# Patient Record
Sex: Female | Born: 1965
Health system: Southern US, Community
[De-identification: ages and names within clinical notes are randomized; demographics above are authoritative.]

## PROBLEM LIST (undated history)

## (undated) DIAGNOSIS — I10 Essential (primary) hypertension: Secondary | ICD-10-CM

## (undated) DIAGNOSIS — Z973 Presence of spectacles and contact lenses: Secondary | ICD-10-CM

## (undated) DIAGNOSIS — N882 Stricture and stenosis of cervix uteri: Secondary | ICD-10-CM

## (undated) DIAGNOSIS — Z860100 Personal history of colon polyps, unspecified: Secondary | ICD-10-CM

## (undated) DIAGNOSIS — E785 Hyperlipidemia, unspecified: Secondary | ICD-10-CM

## (undated) DIAGNOSIS — N840 Polyp of corpus uteri: Secondary | ICD-10-CM

## (undated) DIAGNOSIS — I4510 Unspecified right bundle-branch block: Secondary | ICD-10-CM

## (undated) HISTORY — PX: COLONOSCOPY: SHX174

## (undated) HISTORY — PX: CERVICAL BIOPSY  W/ LOOP ELECTRODE EXCISION: SUR135

---

## 2002-05-15 ENCOUNTER — Other Ambulatory Visit: Admission: RE | Admit: 2002-05-15 | Discharge: 2002-05-15 | Payer: Self-pay | Admitting: Obstetrics and Gynecology

## 2003-05-30 ENCOUNTER — Other Ambulatory Visit: Admission: RE | Admit: 2003-05-30 | Discharge: 2003-05-30 | Payer: Self-pay | Admitting: Obstetrics and Gynecology

## 2004-03-16 ENCOUNTER — Ambulatory Visit: Payer: Self-pay | Admitting: Internal Medicine

## 2004-03-25 ENCOUNTER — Ambulatory Visit: Payer: Self-pay | Admitting: Internal Medicine

## 2004-06-05 ENCOUNTER — Other Ambulatory Visit: Admission: RE | Admit: 2004-06-05 | Discharge: 2004-06-05 | Payer: Self-pay | Admitting: Obstetrics and Gynecology

## 2005-06-11 ENCOUNTER — Other Ambulatory Visit: Admission: RE | Admit: 2005-06-11 | Discharge: 2005-06-11 | Payer: Self-pay | Admitting: Obstetrics and Gynecology

## 2005-08-19 ENCOUNTER — Ambulatory Visit: Payer: Self-pay | Admitting: Internal Medicine

## 2005-08-27 ENCOUNTER — Ambulatory Visit: Payer: Self-pay | Admitting: Internal Medicine

## 2005-10-04 ENCOUNTER — Ambulatory Visit: Payer: Self-pay | Admitting: Internal Medicine

## 2005-10-08 ENCOUNTER — Encounter (INDEPENDENT_AMBULATORY_CARE_PROVIDER_SITE_OTHER): Payer: Self-pay | Admitting: Specialist

## 2005-10-08 ENCOUNTER — Ambulatory Visit: Admission: RE | Admit: 2005-10-08 | Discharge: 2005-10-08 | Payer: Self-pay | Admitting: Surgery

## 2005-10-08 HISTORY — PX: LAPAROSCOPIC CHOLECYSTECTOMY: SUR755

## 2005-12-03 ENCOUNTER — Ambulatory Visit: Payer: Self-pay | Admitting: Internal Medicine

## 2006-06-09 ENCOUNTER — Ambulatory Visit: Payer: Self-pay | Admitting: Internal Medicine

## 2006-06-10 LAB — CONVERTED CEMR LAB
HCT: 41.5 % (ref 36.0–46.0)
MCV: 81.5 fL (ref 78.0–100.0)
Neutrophils Relative %: 59.7 % (ref 43.0–77.0)
Platelets: 162 10*3/uL (ref 150–400)
RDW: 12.3 % (ref 11.5–14.6)

## 2006-10-06 ENCOUNTER — Ambulatory Visit: Payer: Self-pay | Admitting: Internal Medicine

## 2007-03-17 DIAGNOSIS — D649 Anemia, unspecified: Secondary | ICD-10-CM | POA: Insufficient documentation

## 2007-03-17 DIAGNOSIS — M6740 Ganglion, unspecified site: Secondary | ICD-10-CM | POA: Insufficient documentation

## 2007-03-17 DIAGNOSIS — L659 Nonscarring hair loss, unspecified: Secondary | ICD-10-CM

## 2007-03-31 ENCOUNTER — Ambulatory Visit: Payer: Self-pay | Admitting: Internal Medicine

## 2007-03-31 LAB — CONVERTED CEMR LAB
Basophils Relative: 0.4 % (ref 0.0–1.0)
HCT: 41.6 % (ref 36.0–46.0)
Hemoglobin: 13.9 g/dL (ref 12.0–15.0)
Lymphocytes Relative: 32.3 % (ref 12.0–46.0)
Monocytes Relative: 5.1 % (ref 3.0–11.0)
Platelets: 124 10*3/uL — ABNORMAL LOW (ref 150–400)
RDW: 13 % (ref 11.5–14.6)

## 2007-08-21 ENCOUNTER — Encounter: Payer: Self-pay | Admitting: Internal Medicine

## 2007-12-11 ENCOUNTER — Ambulatory Visit: Payer: Self-pay | Admitting: Internal Medicine

## 2007-12-11 DIAGNOSIS — M542 Cervicalgia: Secondary | ICD-10-CM

## 2009-08-22 ENCOUNTER — Encounter: Admission: RE | Admit: 2009-08-22 | Discharge: 2009-08-22 | Payer: Self-pay | Admitting: Obstetrics and Gynecology

## 2010-05-17 ENCOUNTER — Encounter: Payer: Self-pay | Admitting: Obstetrics and Gynecology

## 2010-06-10 ENCOUNTER — Other Ambulatory Visit (INDEPENDENT_AMBULATORY_CARE_PROVIDER_SITE_OTHER): Payer: BC Managed Care – PPO | Admitting: Internal Medicine

## 2010-06-10 DIAGNOSIS — Z0000 Encounter for general adult medical examination without abnormal findings: Secondary | ICD-10-CM

## 2010-06-10 LAB — BASIC METABOLIC PANEL
CO2: 26 mEq/L (ref 19–32)
Calcium: 10 mg/dL (ref 8.4–10.5)
Chloride: 106 mEq/L (ref 96–112)
Glucose, Bld: 86 mg/dL (ref 70–99)
Potassium: 4.4 mEq/L (ref 3.5–5.1)
Sodium: 139 mEq/L (ref 135–145)

## 2010-06-10 LAB — LIPID PANEL
HDL: 46.6 mg/dL (ref >39.00)
LDL Cholesterol: 88 mg/dL (ref 0–99)
Triglycerides: 166 mg/dL — ABNORMAL HIGH (ref 0.0–149.0)
VLDL: 33.2 mg/dL (ref 0.0–40.0)

## 2010-06-10 LAB — POCT URINALYSIS DIPSTICK
Bilirubin, UA: NEGATIVE
Blood, UA: NEGATIVE
Glucose, UA: NEGATIVE
Leukocytes, UA: NEGATIVE
Spec Grav, UA: 1.005

## 2010-06-10 LAB — HEPATIC FUNCTION PANEL: AST: 20 U/L (ref 0–37)

## 2010-06-10 LAB — TSH: TSH: 2.86 u[IU]/mL (ref 0.35–5.50)

## 2010-06-11 LAB — CBC WITH DIFFERENTIAL/PLATELET
Basophils Absolute: 0.1 10*3/uL (ref 0.0–0.1)
Eosinophils Absolute: 0 10*3/uL (ref 0.0–0.7)
Eosinophils Relative: 0.3 % (ref 0.0–5.0)
HCT: 44.5 % (ref 36.0–46.0)
Neutro Abs: 5.2 10*3/uL (ref 1.4–7.7)
Neutrophils Relative %: 53.8 % (ref 43.0–77.0)
Platelets: 215 10*3/uL (ref 150.0–400.0)
RBC: 5.23 Mil/uL — ABNORMAL HIGH (ref 3.87–5.11)
WBC: 9.7 10*3/uL (ref 4.5–10.5)

## 2010-06-26 ENCOUNTER — Encounter: Payer: Self-pay | Admitting: Internal Medicine

## 2010-06-29 ENCOUNTER — Encounter: Payer: Self-pay | Admitting: Internal Medicine

## 2010-06-29 ENCOUNTER — Ambulatory Visit (INDEPENDENT_AMBULATORY_CARE_PROVIDER_SITE_OTHER): Payer: BC Managed Care – PPO | Admitting: Internal Medicine

## 2010-06-29 VITALS — BP 114/84 | HR 84 | Temp 99.2°F | Ht 59.0 in | Wt 158.0 lb

## 2010-06-29 DIAGNOSIS — Z0000 Encounter for general adult medical examination without abnormal findings: Secondary | ICD-10-CM

## 2010-06-29 DIAGNOSIS — Z136 Encounter for screening for cardiovascular disorders: Secondary | ICD-10-CM

## 2010-09-11 NOTE — Op Note (Signed)
Morgan Barker, Morgan Barker                 ACCOUNT NO.:  [id number]   MEDICAL RECORD NO.:  [id number]          PATIENT TYPE:  AMB   LOCATION:  [facility]                         FACILITY:  [facility]   PHYSICIAN:  Wilmon Arms. Corliss Skains, M.D. DATE OF BIRTH:  [birth year]   DATE OF PROCEDURE:  10/08/2005  DATE OF DISCHARGE:                                 OPERATIVE REPORT   PREOP DIAGNOSIS:  Chronic calculus cholecystitis.   POSTOPERATIVE DIAGNOSIS:  Chronic calculus cholecystitis.   PROCEDURE PERFORMED:  Laparoscopic cholecystectomy with intraoperative  cholangiogram.   SURGEON:  Wilmon Arms. Tsuei, M.D.   ANESTHESIA:  General endotracheal.   INDICATIONS:  The patient is a 45 year old female who presents with a 3-  month history of right upper quadrant epigastric pain radiating through to  her back.  This was associated with some nausea and bloating.  Liver  function tests were normal.  Abdominal ultrasound showed several mobile  gallstones.  There was no gallbladder wall thickening.  The decision was  made to proceed with laparoscopic cholecystectomy.   DESCRIPTION OF PROCEDURE:  The patient was brought to the operating room,  placed in the supine position operating table.  After an adequate level of  general endotracheal anesthesia was obtained, the patient's abdomen was  prepped with Betadine and draped in sterile fashion.  A time-out was taken  to insure the proper patient and proper procedure.  A transverse incision  was made just below the umbilicus after infiltrating with 1/4% Marcaine.  The fascia was opened vertically; and the peritoneal cavity was bluntly  entered.  A stay sutures of #0 Vicryl was placed around the fascial opening  in pursestring fashion.  The Hasson cannula was inserted and secured with a  stay suture.  Pneumoperitoneum was obtained by insufflating CO2 and  maintaining maximum pressure of 15 mmHg.  A 10-mm port was placed in  subxiphoid position; two 5-mm ports were placed in  the right upper quadrant.   The liver and stomach appeared to be normal.  The gallbladder was grasped  with a clamp and elevated over the edge of the liver.  The omentum was  adherent to the surface of the gallbladder.  This was taken down with  cautery.  The peritoneum around the hilum of the gallbladder was opened.  The cystic duct was identified; and was circumferentially dissected.  The  cystic artery was also circumferentially dissected, ligated with clips, and  divided.  The cystic duct was ligated with a clip distally.  An opening was  created on the cystic duct; and a Cook cholangiogram catheter was inserted  through a stab incision, and threaded into the cystic duct.  It was secured  with a clip; and a cholangiogram was then obtained.  The cholangiogram  showed good flow proximally and distally in the biliary tree with good flow  into the duodenum.  There was a small filling defect in the mid common duct  which appeared to float.  Initially there were two; and then these seemed to  coalesce into one.  We asked the radiologist to also examine the  cholangiogram; there was some question whether this were presented common  duct stone versus air bubbles.  The decision was made to proceed with he  cholecystectomy as the patient's liver function tests were normal.  We will  recheck her liver function tests, next week, and proceed with ERCP if there  is any evidence of obstruction.   The cholangiogram catheter was then removed.  The cystic duct was likewise  clipped and divided.  An additional cystic artery branch was ligated with  clips and divided.  Cautery was then used to dissect the gallbladder away  from the liver.  The gallbladder was removed through the umbilical opening.  The ports were all reinserted and the gallbladder fossa was reinspected.  Hemostasis was obtained with cautery.  This area was thoroughly irrigated  and no further bleeding was noted.  The patient was rolled  back to a flat  position.  The ports removed under direct vision; as pneumoperitoneum was  released.  The umbilical fascia was closed the pursestring suture; 4-0  Monocryl was used to close all the skin incisions in a subcuticular fashion.  Steri-Strips and clean dressings were applied.  The patient was then  extubated, and brought to recovery room in stable condition.  All sponge,  instrument, and needle counts were correct.      Wilmon Arms. Tsuei, M.D.  Electronically Signed     MKT/MEDQ  D:  10/08/2005  T:  10/08/2005  Job:  [id number]

## 2011-03-29 ENCOUNTER — Other Ambulatory Visit: Payer: Self-pay

## 2011-03-30 ENCOUNTER — Other Ambulatory Visit: Payer: Self-pay

## 2011-03-30 ENCOUNTER — Emergency Department (HOSPITAL_COMMUNITY)
Admission: EM | Admit: 2011-03-30 | Discharge: 2011-03-30 | Disposition: A | Discharge status: Home / Self Care | Payer: BC Managed Care – PPO | Attending: Emergency Medicine | Admitting: Emergency Medicine

## 2011-03-30 ENCOUNTER — Encounter (HOSPITAL_COMMUNITY): Payer: Self-pay | Admitting: *Deleted

## 2011-03-30 ENCOUNTER — Emergency Department (HOSPITAL_COMMUNITY): Payer: BC Managed Care – PPO

## 2011-03-30 DIAGNOSIS — S5010XA Contusion of unspecified forearm, initial encounter: Secondary | ICD-10-CM | POA: Insufficient documentation

## 2011-03-30 DIAGNOSIS — Y92410 Unspecified street and highway as the place of occurrence of the external cause: Secondary | ICD-10-CM | POA: Insufficient documentation

## 2011-03-30 DIAGNOSIS — T1490XA Injury, unspecified, initial encounter: Secondary | ICD-10-CM | POA: Insufficient documentation

## 2011-03-30 DIAGNOSIS — R079 Chest pain, unspecified: Secondary | ICD-10-CM | POA: Insufficient documentation

## 2011-03-30 MED ORDER — CYCLOBENZAPRINE HCL 10 MG PO TABS: 10.0000 mg | ORAL_TABLET | Freq: Two times a day (BID) | ORAL | Status: AC | PRN

## 2011-03-30 MED ORDER — OXYCODONE-ACETAMINOPHEN 5-325 MG PO TABS: 1.0000 | ORAL_TABLET | Freq: Four times a day (QID) | ORAL | Status: AC | PRN

## 2011-03-30 MED ORDER — LORAZEPAM 0.5 MG PO TABS
0.5000 mg | ORAL_TABLET | Freq: Once | ORAL | Status: AC
Start: 2011-03-30 — End: 2011-03-30
  Administered 2011-03-30: 0.5 mg via ORAL
  Filled 2011-03-30: qty 1
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
## 2011-03-30 NOTE — ED Provider Notes (Signed)
Medical screening examination/treatment/procedure(s) were performed by non-physician practitioner and as supervising physician I was immediately available for consultation/collaboration.   Joya Gaskins, MD 03/30/11 [phone]

## 2011-03-30 NOTE — ED Notes (Signed)
Called pt's brother per pt request, left a message and return number.
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
## 2011-05-11 ENCOUNTER — Ambulatory Visit (INDEPENDENT_AMBULATORY_CARE_PROVIDER_SITE_OTHER): Payer: BC Managed Care – PPO | Admitting: Family

## 2011-05-11 ENCOUNTER — Encounter: Payer: Self-pay | Admitting: Family

## 2011-05-11 VITALS — BP 156/104 | Temp 99.9°F | Wt 181.0 lb

## 2011-05-11 DIAGNOSIS — I10 Essential (primary) hypertension: Secondary | ICD-10-CM

## 2011-05-11 DIAGNOSIS — R42 Dizziness and giddiness: Secondary | ICD-10-CM

## 2011-05-11 MED ORDER — HYDROCHLOROTHIAZIDE 25 MG PO TABS: 25.0000 mg | ORAL_TABLET | Freq: Every day | ORAL | Status: DC

## 2011-05-11 NOTE — Progress Notes (Signed)
Subjective:    Patient ID: Morgan Barker, female    DOB: 1965-10-13, 46 y.o.   MRN: [id number]  HPI 46 year old [place] American female, nonsmoker, patient of Dr. Lovell Sheehan is an today with complaints of elevated blood pressure. 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 She has not been hypertensive in the past. She does have a family history of hypertension. She's had a 25 pound weight gain over the last 10 months. She was previously exercising and is not exercising anymore. She denies any shortness of breath, chest pain, nausea, vomiting, or edema.  Review of Systems  Constitutional: Negative.   HENT: Negative.   Eyes: Negative.   Respiratory: Negative.   Cardiovascular: Negative.   Gastrointestinal: Negative.   Genitourinary: Negative.   Musculoskeletal: Negative.   Skin: Negative.   Neurological: Positive for light-headedness.  Hematological: Negative.   Psychiatric/Behavioral: Negative.    Past Medical History  Diagnosis Date  . Anemia     History   Social History  . Marital Status: Single    Spouse Name: N/A    Number of Children: N/A  . Years of Education: N/A   Occupational History  . Not on file.   Social History Main Topics  . Smoking status: Never Smoker   . Smokeless tobacco: Not on file  . Alcohol Use: Not on file  . Drug Use: Not on file  . Sexually Active: Not on file   Other Topics Concern  . Not on file   Social History Narrative  . No narrative on file    Past Surgical History  Procedure Date  . Cholecystectomy     No family history on file.  No Known Allergies  Current Outpatient Prescriptions on File Prior to Visit  Medication Sig Dispense Refill  . desogestrel-ethinyl estradiol (KARIVA,AZURETTE,MIRCETTE) 0.15-0.02/0.01 MG (21/5) tablet Take 1 tablet by mouth daily.        . Multiple Vitamins-Iron (MULTIVITAMIN/IRON PO) Take by mouth daily.        Marland Kitchen  AZURETTE 0.15-0.02/0.01 MG (21/5) per tablet       . celecoxib (CELEBREX) 200 MG capsule Take 200 mg by mouth 2 (two) times daily.       . cyclobenzaprine (FEXMID) 7.5 MG tablet Take 7.5 mg by mouth 3 (three) times daily as needed.          BP 156/104  Temp(Src) 99.9 F (37.7 C) (Oral)  Wt 181 lb (82.101 kg)chart    Objective:   Physical Exam  Constitutional: She is oriented to person, place, and time. She appears well-developed and well-nourished.  HENT:  Right Ear: External ear normal.  Left Ear: External ear normal.  Nose: Nose normal.  Mouth/Throat: Oropharynx is clear and moist.  Eyes: Conjunctivae are normal. Pupils are equal, round, and reactive to light.  Cardiovascular: Normal rate, regular rhythm and normal heart sounds.   Pulmonary/Chest: Effort normal and breath sounds normal.  Musculoskeletal: Normal range of motion.  Neurological: She is alert and oriented to person, place, and time.  Skin: Skin is warm and dry.  Psychiatric: She has a normal mood and affect.          Assessment & Plan:  Assessment: Hypertension, weight  gain  Plan: HCTZ 25 mg once daily. Exercise to reduce her weight. Watch sodium intake in her diet. We'll bring patient back for recheck in 2 weeks to reassess her blood pressure. Patient to call with  any questions or concerns.

## 2011-05-11 NOTE — Patient Instructions (Signed)
Hypertension As your heart beats, it forces blood through your arteries. This force is your blood pressure. If the pressure is too high, it is called hypertension (HTN) or high blood pressure. HTN is dangerous because you may have it and not know it. High blood pressure may mean that your heart has to work harder to pump blood. Your arteries may be narrow or stiff. The extra work puts you at risk for heart disease, stroke, and other problems.  Blood pressure consists of two numbers, a higher number over a lower, 110/72, for example. It is stated as "110 over 72." The ideal is below 120 for the top number (systolic) and under 80 for the bottom (diastolic). Write down your blood pressure today. You should pay close attention to your blood pressure if you have certain conditions such as:  Heart failure.   Prior heart attack.   Diabetes   Chronic kidney disease.   Prior stroke.   Multiple risk factors for heart disease.  To see if you have HTN, your blood pressure should be measured while you are seated with your arm held at the level of the heart. It should be measured at least twice. A one-time elevated blood pressure reading (especially in the Emergency Department) does not mean that you need treatment. There may be conditions in which the blood pressure is different between your right and left arms. It is important to see your caregiver soon for a recheck. Most people have essential hypertension which means that there is not a specific cause. This type of high blood pressure may be lowered by changing lifestyle factors such as:  Stress.   Smoking.   Lack of exercise.   Excessive weight.   Drug/tobacco/alcohol use.   Eating less salt.  Most people do not have symptoms from high blood pressure until it has caused damage to the body. Effective treatment can often prevent, delay or reduce that damage. TREATMENT  When a cause has been identified, treatment for high blood pressure is  directed at the cause. There are a large number of medications to treat HTN. These fall into several categories, and your caregiver will help you select the medicines that are best for you. Medications may have side effects. You should review side effects with your caregiver. If your blood pressure stays high after you have made lifestyle changes or started on medicines,   Your medication(s) may need to be changed.   Other problems may need to be addressed.   Be certain you understand your prescriptions, and know how and when to take your medicine.   Be sure to follow up with your caregiver within the time frame advised (usually within two weeks) to have your blood pressure rechecked and to review your medications.   If you are taking more than one medicine to lower your blood pressure, make sure you know how and at what times they should be taken. Taking two medicines at the same time can result in blood pressure that is too low.  SEEK IMMEDIATE MEDICAL CARE IF:  You develop a severe headache, blurred or changing vision, or confusion.   You have unusual weakness or numbness, or a faint feeling.   You have severe chest or abdominal pain, vomiting, or breathing problems.  MAKE SURE YOU:   Understand these instructions.   Will watch your condition.   Will get help right away if you are not doing well or get worse.  Document Released: 04/12/2005 Document Revised: 12/23/2010 Document Reviewed:   12/01/2007 [employer] Patient Information 2012 [place], [place].  Exercise to Lose Weight Exercise and a healthy diet may help you lose weight. Your doctor may suggest specific exercises. EXERCISE IDEAS AND TIPS  Choose low-cost things you enjoy doing, such as walking, bicycling, or exercising to workout videos.   Take stairs instead of the elevator.   Walk during your lunch break.   Park your car further away from work or school.   Go to a gym or an exercise class.   Start with 5 to 10  minutes of exercise each day. Build up to 30 minutes of exercise 4 to 6 days a week.   Wear shoes with good support and comfortable clothes.   Stretch before and after working out.   Work out until you breathe harder and your heart beats faster.   Drink extra water when you exercise.   Do not do so much that you hurt yourself, feel dizzy, or get very short of breath.  Exercises that burn about 150 calories:  Running 1  miles in 15 minutes.   Playing volleyball for 45 to 60 minutes.   Washing and waxing a car for 45 to 60 minutes.   Playing touch football for 45 minutes.   Walking 1  miles in 35 minutes.   Pushing a stroller 1  miles in 30 minutes.   Playing basketball for 30 minutes.   Raking leaves for 30 minutes.   Bicycling 5 miles in 30 minutes.   Walking 2 miles in 30 minutes.   Dancing for 30 minutes.   Shoveling snow for 15 minutes.   Swimming laps for 20 minutes.   Walking up stairs for 15 minutes.   Bicycling 4 miles in 15 minutes.   Gardening for 30 to 45 minutes.   Jumping rope for 15 minutes.   Washing windows or floors for 45 to 60 minutes.  Document Released: 05/15/2010 Document Revised: 12/23/2010 Document Reviewed: 05/15/2010 [facility] Patient Information 2012 [place], [place].

## 2011-05-25 ENCOUNTER — Encounter: Payer: Self-pay | Admitting: Family

## 2011-05-25 ENCOUNTER — Ambulatory Visit (INDEPENDENT_AMBULATORY_CARE_PROVIDER_SITE_OTHER): Payer: BC Managed Care – PPO | Admitting: Family

## 2011-05-25 VITALS — BP 130/90 | HR 88 | Temp 98.3°F | Resp 16 | Ht 59.0 in | Wt 177.0 lb

## 2011-05-25 DIAGNOSIS — I10 Essential (primary) hypertension: Secondary | ICD-10-CM

## 2011-05-25 DIAGNOSIS — E669 Obesity, unspecified: Secondary | ICD-10-CM

## 2011-05-25 NOTE — Progress Notes (Signed)
  Subjective:    Patient ID: Morgan Barker, female    DOB: May 29, 1965, 46 y.o.   MRN: [id number]  HPI 46 year old [place] American female, nonsmoker, patient of Dr. Lovell Sheehan is in for recheck of hypertension. She was started on hydrochlorothiazide 25 mg daily, she's tolerating the medication well. She is currently 177 pounds, which is a significant weight gain from last year. She has been trying to modify her diet and get more exercise recently. She denies any lightheadedness, dizziness, chest pain, palpitations, shortness of breath or edema.  Complete physical exam is due in March   Review of Systems  Constitutional: Negative.   HENT: Negative.   Cardiovascular: Negative.   Gastrointestinal: Negative.   Musculoskeletal: Negative.   Skin: Negative.   Neurological: Negative.   Hematological: Negative.   Psychiatric/Behavioral: Negative.    Past Medical History  Diagnosis Date  . Anemia     History   Social History  . Marital Status: Single    Spouse Name: N/A    Number of Children: N/A  . Years of Education: N/A   Occupational History  . Not on file.   Social History Main Topics  . Smoking status: Never Smoker   . Smokeless tobacco: Not on file  . Alcohol Use: Not on file  . Drug Use: Not on file  . Sexually Active: Not on file   Other Topics Concern  . Not on file   Social History Narrative  . No narrative on file    Past Surgical History  Procedure Date  . Cholecystectomy     No family history on file.  No Known Allergies  Current Outpatient Prescriptions on File Prior to Visit  Medication Sig Dispense Refill  . AZURETTE 0.15-0.02/0.01 MG (21/5) per tablet       . hydrochlorothiazide (HYDRODIURIL) 25 MG tablet Take 1 tablet (25 mg total) by mouth daily.  30 tablet  1  . Multiple Vitamins-Iron (MULTIVITAMIN/IRON PO) Take by mouth daily.          BP 130/90  Pulse 88  Temp 98.3 F (36.8 C)  Resp 16  Ht 4\' 11"  (1.499 m)  Wt 177 lb (80.287 kg)  BMI  35.75 kg/m2chart    Objective:   Physical Exam  Constitutional: She is oriented to person, place, and time. She appears well-developed and well-nourished.  HENT:  Right Ear: External ear normal.  Left Ear: External ear normal.  Nose: Nose normal.  Mouth/Throat: Oropharynx is clear and moist.  Neck: Normal range of motion. Neck supple.  Cardiovascular: Normal rate, regular rhythm and normal heart sounds.   Pulmonary/Chest: Effort normal and breath sounds normal.  Musculoskeletal: Normal range of motion.  Neurological: She is alert and oriented to person, place, and time.  Skin: Skin is warm and dry.  Psychiatric: She has a normal mood and affect.          Assessment & Plan:  Assessment: Hypertension, obesity  Plan: Strongly encouraged healthy diet and exercise, low sodium diet. Encouraged weight reduction. We'll continue her on hydrochlorothiazide once daily. She will followup with Dr. Lovell Sheehan for complete physical exam in March and schedule. Lab order sent to include BMP, CBC, TSH, and lipids. Patient to call the office with any questions or concerns prior to that appointment.

## 2011-05-25 NOTE — Patient Instructions (Signed)
1. Coricidin HBP OTC for upper respiratory symptoms. 2. Follow-up for annual CPX  Exercise to Stay Healthy Exercise helps you become and stay healthy. EXERCISE IDEAS AND TIPS Choose exercises that:  You enjoy.   Fit into your day.  You do not need to exercise really hard to be healthy. You can do exercises at a slow or medium level and stay healthy. You can:  Stretch before and after working out.   Try yoga, Pilates, or tai chi.   Lift weights.   Walk fast, swim, jog, run, climb stairs, bicycle, dance, or rollerskate.   Take aerobic classes.  Exercises that burn about 150 calories:  Running 1  miles in 15 minutes.   Playing volleyball for 45 to 60 minutes.   Washing and waxing a car for 45 to 60 minutes.   Playing touch football for 45 minutes.   Walking 1  miles in 35 minutes.   Pushing a stroller 1  miles in 30 minutes.   Playing basketball for 30 minutes.   Raking leaves for 30 minutes.   Bicycling 5 miles in 30 minutes.   Walking 2 miles in 30 minutes.   Dancing for 30 minutes.   Shoveling snow for 15 minutes.   Swimming laps for 20 minutes.   Walking up stairs for 15 minutes.   Bicycling 4 miles in 15 minutes.   Gardening for 30 to 45 minutes.   Jumping rope for 15 minutes.   Washing windows or floors for 45 to 60 minutes.  Document Released: 05/15/2010 Document Revised: 12/23/2010 Document Reviewed: 05/15/2010 [facility] Patient Information 2012 [place], [place].

## 2011-06-24 ENCOUNTER — Other Ambulatory Visit (INDEPENDENT_AMBULATORY_CARE_PROVIDER_SITE_OTHER): Payer: BC Managed Care – PPO

## 2011-06-24 DIAGNOSIS — I10 Essential (primary) hypertension: Secondary | ICD-10-CM

## 2011-06-24 DIAGNOSIS — Z0000 Encounter for general adult medical examination without abnormal findings: Secondary | ICD-10-CM

## 2011-06-24 LAB — BASIC METABOLIC PANEL
BUN: 10 mg/dL (ref 6–23)
Calcium: 9.5 mg/dL (ref 8.4–10.5)
Creatinine, Ser: 0.8 mg/dL (ref 0.4–1.2)
GFR: 106.31 mL/min (ref >60.00)

## 2011-06-24 LAB — POCT URINALYSIS DIPSTICK
Bilirubin, UA: NEGATIVE
Glucose, UA: NEGATIVE
Ketones, UA: NEGATIVE
Protein, UA: NEGATIVE
Urobilinogen, UA: 0.2
pH, UA: 6

## 2011-06-24 LAB — CBC
Platelets: 245 10*3/uL (ref 150.0–400.0)
RBC: 5.11 Mil/uL (ref 3.87–5.11)
RDW: 13.5 % (ref 11.5–14.6)

## 2011-06-24 LAB — CHOLESTEROL, TOTAL: Cholesterol: 140 mg/dL (ref 0–200)

## 2011-06-24 LAB — LIPID PANEL
LDL Cholesterol: 56 mg/dL (ref 0–99)
Triglycerides: 165 mg/dL — ABNORMAL HIGH (ref 0.0–149.0)
VLDL: 33 mg/dL (ref 0.0–40.0)

## 2011-06-24 LAB — HEPATIC FUNCTION PANEL
ALT: 26 U/L (ref 0–35)
Alkaline Phosphatase: 36 U/L — ABNORMAL LOW (ref 39–117)
Bilirubin, Direct: 0 mg/dL (ref 0.0–0.3)

## 2011-06-24 LAB — TSH: TSH: 2.11 u[IU]/mL (ref 0.35–5.50)

## 2011-07-02 ENCOUNTER — Ambulatory Visit (INDEPENDENT_AMBULATORY_CARE_PROVIDER_SITE_OTHER): Payer: BC Managed Care – PPO | Admitting: Internal Medicine

## 2011-07-02 ENCOUNTER — Encounter: Payer: Self-pay | Admitting: Internal Medicine

## 2011-07-02 VITALS — BP 140/90 | HR 76 | Temp 98.3°F | Resp 16 | Ht 59.0 in | Wt 176.0 lb

## 2011-07-02 DIAGNOSIS — Z0000 Encounter for general adult medical examination without abnormal findings: Secondary | ICD-10-CM

## 2011-07-02 DIAGNOSIS — I10 Essential (primary) hypertension: Secondary | ICD-10-CM | POA: Insufficient documentation

## 2011-07-02 MED ORDER — LOSARTAN POTASSIUM-HCTZ 50-12.5 MG PO TABS: 1.0000 | ORAL_TABLET | Freq: Every day | ORAL | Status: DC

## 2011-07-02 NOTE — Patient Instructions (Signed)
The patient is instructed to continue all medications as prescribed. Schedule followup with check out clerk upon leaving the clinic  

## 2011-07-02 NOTE — Progress Notes (Signed)
  Subjective:    Patient ID: Morgan Barker, female    DOB: [birth year], 46 y.o.   MRN: [id number]  HPI    Review of Systems     Objective:   Physical Exam        Assessment & Plan:

## 2011-08-24 ENCOUNTER — Other Ambulatory Visit: Payer: Self-pay | Admitting: Obstetrics and Gynecology

## 2011-08-24 DIAGNOSIS — R928 Other abnormal and inconclusive findings on diagnostic imaging of breast: Secondary | ICD-10-CM

## 2011-08-27 ENCOUNTER — Ambulatory Visit
Admission: RE | Admit: 2011-08-27 | Discharge: 2011-08-27 | Disposition: A | Discharge status: Home / Self Care | Payer: BC Managed Care – PPO | Source: Ambulatory Visit | Attending: Obstetrics and Gynecology | Admitting: Obstetrics and Gynecology

## 2011-08-27 DIAGNOSIS — R928 Other abnormal and inconclusive findings on diagnostic imaging of breast: Secondary | ICD-10-CM

## 2011-08-30 ENCOUNTER — Other Ambulatory Visit: Payer: Self-pay | Admitting: *Deleted

## 2011-08-30 DIAGNOSIS — I10 Essential (primary) hypertension: Secondary | ICD-10-CM

## 2011-08-30 MED ORDER — LOSARTAN POTASSIUM-HCTZ 50-12.5 MG PO TABS: 1.0000 | ORAL_TABLET | Freq: Every day | ORAL | Status: DC

## 2011-09-03 ENCOUNTER — Encounter: Payer: Self-pay | Admitting: Internal Medicine

## 2011-09-03 ENCOUNTER — Ambulatory Visit (INDEPENDENT_AMBULATORY_CARE_PROVIDER_SITE_OTHER): Payer: BC Managed Care – PPO | Admitting: Internal Medicine

## 2011-09-03 VITALS — BP 130/84 | HR 76 | Temp 98.0°F | Resp 16 | Ht 59.0 in | Wt 180.0 lb

## 2011-09-03 DIAGNOSIS — D649 Anemia, unspecified: Secondary | ICD-10-CM

## 2011-09-03 DIAGNOSIS — I10 Essential (primary) hypertension: Secondary | ICD-10-CM

## 2011-09-03 MED ORDER — LOSARTAN POTASSIUM-HCTZ 100-12.5 MG PO TABS: 1.0000 | ORAL_TABLET | Freq: Every day | ORAL | Status: DC

## 2011-09-03 NOTE — Patient Instructions (Signed)
The patient is instructed to continue all medications as prescribed. Schedule followup with check out clerk upon leaving the clinic  

## 2011-12-07 ENCOUNTER — Encounter: Payer: Self-pay | Admitting: Internal Medicine

## 2011-12-07 ENCOUNTER — Ambulatory Visit (INDEPENDENT_AMBULATORY_CARE_PROVIDER_SITE_OTHER): Payer: BC Managed Care – PPO | Admitting: Internal Medicine

## 2011-12-07 VITALS — BP 130/78 | HR 72 | Temp 98.6°F | Resp 16 | Ht 59.0 in | Wt 170.0 lb

## 2011-12-07 DIAGNOSIS — T887XXA Unspecified adverse effect of drug or medicament, initial encounter: Secondary | ICD-10-CM

## 2011-12-07 DIAGNOSIS — I10 Essential (primary) hypertension: Secondary | ICD-10-CM

## 2011-12-07 DIAGNOSIS — D649 Anemia, unspecified: Secondary | ICD-10-CM

## 2011-12-07 LAB — BASIC METABOLIC PANEL
Calcium: 9.7 mg/dL (ref 8.4–10.5)
GFR: 118.61 mL/min (ref >60.00)
Glucose, Bld: 97 mg/dL (ref 70–99)
Potassium: 3.6 mEq/L (ref 3.5–5.1)
Sodium: 137 mEq/L (ref 135–145)

## 2011-12-07 LAB — CBC WITH DIFFERENTIAL/PLATELET
Basophils Absolute: 0 10*3/uL (ref 0.0–0.1)
Eosinophils Relative: 0.6 % (ref 0.0–5.0)
HCT: 44.4 % (ref 36.0–46.0)
Hemoglobin: 14 g/dL (ref 12.0–15.0)
Lymphocytes Relative: 35.7 % (ref 12.0–46.0)
Monocytes Relative: 6.1 % (ref 3.0–12.0)
Neutro Abs: 4.5 10*3/uL (ref 1.4–7.7)
RBC: 5.24 Mil/uL — ABNORMAL HIGH (ref 3.87–5.11)
RDW: 13.9 % (ref 11.5–14.6)
WBC: 7.9 10*3/uL (ref 4.5–10.5)

## 2011-12-07 NOTE — Progress Notes (Signed)
Subjective:    Patient ID: Morgan Barker, female    DOB: Dec 20, 1965, 46 y.o.   MRN: [id number]  HPI Patient is a 46 year old female who is followed for hypertension and anemia this has been felt to be iron deficiency anemia from excessive menstrual cycle she is now undergoing some premenopausal changes her menstrual cycles have decreased and she also states that she has gone off birth control at this time.  Blood pressure is well controlled on her current medication she is tolerating the medication well without side effects there is a diuretic in the blood pressure medication therefore we'll need to monitor her renal function as well as her potassium   Review of Systems  Constitutional: Negative for activity change, appetite change and fatigue.  HENT: Negative for ear pain, congestion, neck pain, postnasal drip and sinus pressure.   Eyes: Negative for redness and visual disturbance.  Respiratory: Negative for cough, shortness of breath and wheezing.   Gastrointestinal: Negative for abdominal pain and abdominal distention.  Genitourinary: Negative for dysuria, frequency and menstrual problem.  Musculoskeletal: Negative for myalgias, joint swelling and arthralgias.  Skin: Negative for rash and wound.  Neurological: Negative for dizziness, weakness and headaches.  Hematological: Negative for adenopathy. Does not bruise/bleed easily.  Psychiatric/Behavioral: Negative for disturbed wake/sleep cycle and decreased concentration.   Past Medical History  Diagnosis Date  . Anemia     History   Social History  . Marital Status: Single    Spouse Name: N/A    Number of Children: N/A  . Years of Education: N/A   Occupational History  . Not on file.   Social History Main Topics  . Smoking status: Never Smoker   . Smokeless tobacco: Not on file  . Alcohol Use: Not on file  . Drug Use: Not on file  . Sexually Active: Not on file   Other Topics Concern  . Not on file   Social History  Narrative  . No narrative on file    Past Surgical History  Procedure Date  . Cholecystectomy     No family history on file.  No Known Allergies  Current Outpatient Prescriptions on File Prior to Visit  Medication Sig Dispense Refill  . losartan-hydrochlorothiazide (HYZAAR) 100-12.5 MG per tablet Take 1 tablet by mouth daily.  90 tablet  3  . Multiple Vitamins-Iron (MULTIVITAMIN/IRON PO) Take by mouth daily.        Marland Kitchen DISCONTD: hydrochlorothiazide (HYDRODIURIL) 25 MG tablet Take 1 tablet (25 mg total) by mouth daily.  30 tablet  1    BP 130/78  Pulse 72  Temp 98.6 F (37 C)  Resp 16  Ht 4\' 11"  (1.499 m)  Wt 170 lb (77.111 kg)  BMI 34.34 kg/m2       Objective:   Physical Exam  Constitutional: She is oriented to person, place, and time. She appears well-developed and well-nourished. No distress.  HENT:  Head: Normocephalic and atraumatic.  Right Ear: External ear normal.  Left Ear: External ear normal.  Nose: Nose normal.  Mouth/Throat: Oropharynx is clear and moist.  Eyes: Conjunctivae and EOM are normal. Pupils are equal, round, and reactive to light.  Neck: Normal range of motion. Neck supple. No JVD present. No tracheal deviation present. No thyromegaly present.  Cardiovascular: Normal rate, regular rhythm, normal heart sounds and intact distal pulses.   No murmur heard. Pulmonary/Chest: Effort normal and breath sounds normal. She has no wheezes. She exhibits no tenderness.  Abdominal: Soft. Bowel sounds are normal.  Musculoskeletal: Normal range of motion. She exhibits no edema and no tenderness.  Lymphadenopathy:    She has no cervical adenopathy.  Neurological: She is alert and oriented to person, place, and time. She has normal reflexes. No cranial nerve deficit.  Skin: Skin is warm and dry. She is not diaphoretic.  Psychiatric: She has a normal mood and affect. Her behavior is normal.          Assessment & Plan:  Stable blood pressure current  medications monitor a basic metabolic panel to look her potassium and renal function on a diuretic.  History of anemia that has been iron deficiency anemia related to excessive menstrual cycles menstrual cycles have changed with premenopausal status would recommend monitoring and iron and a CBC today and adjust her iron consumption based upon those findings

## 2011-12-07 NOTE — Patient Instructions (Signed)
The patient is instructed to continue all medications as prescribed. Schedule followup with check out clerk upon leaving the clinic  

## 2012-01-19 NOTE — Progress Notes (Signed)
  Subjective:    Patient ID: Morgan Barker, female    DOB: 05-30-1965, 46 y.o.   MRN: [id number]  HPI Patient is a 46 year old African American female who presents for followup of hypertension.  She has a history of anemia and is currently on a multivitamin plus iron.  Her blood pressure is well controlled with Hyzaar   Review of Systems  Constitutional: Negative for activity change, appetite change and fatigue.  HENT: Negative for ear pain, congestion, neck pain, postnasal drip and sinus pressure.   Eyes: Negative for redness and visual disturbance.  Respiratory: Negative for cough, shortness of breath and wheezing.   Gastrointestinal: Negative for abdominal pain and abdominal distention.  Genitourinary: Negative for dysuria, frequency and menstrual problem.  Musculoskeletal: Negative for myalgias, joint swelling and arthralgias.  Skin: Negative for rash and wound.  Neurological: Negative for dizziness, weakness and headaches.  Hematological: Negative for adenopathy. Does not bruise/bleed easily.  Psychiatric/Behavioral: Negative for disturbed wake/sleep cycle and decreased concentration.       Objective:   Physical Exam  Nursing note and vitals reviewed. Constitutional: She appears well-developed and well-nourished.  Eyes: Conjunctivae normal are normal. Pupils are equal, round, and reactive to light.  Cardiovascular: Normal rate and regular rhythm.   Pulmonary/Chest: Effort normal and breath sounds normal.          Assessment & Plan:  Stable blood pressure Hyzaar Monitoring for anemia and assess need to continue iron therapy.

## 2012-06-27 ENCOUNTER — Other Ambulatory Visit: Payer: BC Managed Care – PPO

## 2012-06-28 ENCOUNTER — Other Ambulatory Visit (INDEPENDENT_AMBULATORY_CARE_PROVIDER_SITE_OTHER): Payer: BC Managed Care – PPO

## 2012-06-28 ENCOUNTER — Telehealth: Payer: Self-pay | Admitting: *Deleted

## 2012-06-28 DIAGNOSIS — Z0000 Encounter for general adult medical examination without abnormal findings: Secondary | ICD-10-CM

## 2012-06-28 LAB — HEPATIC FUNCTION PANEL
ALT: 28 U/L (ref 0–35)
AST: 20 U/L (ref 0–37)
Albumin: 4.2 g/dL (ref 3.5–5.2)
Total Bilirubin: 0.8 mg/dL (ref 0.3–1.2)

## 2012-06-28 LAB — TSH: TSH: 2.16 u[IU]/mL (ref 0.35–5.50)

## 2012-06-28 LAB — BASIC METABOLIC PANEL
BUN: 11 mg/dL (ref 6–23)
CO2: 23 mEq/L (ref 19–32)
Chloride: 104 mEq/L (ref 96–112)
Potassium: 3.7 mEq/L (ref 3.5–5.1)

## 2012-06-28 LAB — POCT URINALYSIS DIPSTICK
Bilirubin, UA: NEGATIVE
Blood, UA: NEGATIVE
Leukocytes, UA: NEGATIVE
Nitrite, UA: NEGATIVE
Urobilinogen, UA: 0.2
pH, UA: 5.5

## 2012-06-28 LAB — CBC WITH DIFFERENTIAL/PLATELET
Basophils Absolute: 0 10*3/uL (ref 0.0–0.1)
Eosinophils Relative: 0.5 % (ref 0.0–5.0)
HCT: 43.1 % (ref 36.0–46.0)
Hemoglobin: 14.1 g/dL (ref 12.0–15.0)
Lymphs Abs: 3.7 10*3/uL (ref 0.7–4.0)
MCV: 83.4 fl (ref 78.0–100.0)
Monocytes Absolute: 0.5 10*3/uL (ref 0.1–1.0)
Neutro Abs: 7.8 10*3/uL — ABNORMAL HIGH (ref 1.4–7.7)
Platelets: 172 10*3/uL (ref 150.0–400.0)
RDW: 13.6 % (ref 11.5–14.6)

## 2012-06-28 LAB — LIPID PANEL
HDL: 41.4 mg/dL (ref >39.00)
Total CHOL/HDL Ratio: 5

## 2012-06-28 NOTE — Telephone Encounter (Signed)
Left a message on patients cell phone. She needs to come in and have a Lav. And light  blue top tube drawn. She can come anytime and doesn't need a appointment.

## 2012-06-29 ENCOUNTER — Other Ambulatory Visit: Payer: Self-pay | Admitting: Internal Medicine

## 2012-06-29 LAB — CBC WITH DIFFERENTIAL/PLATELET
Basophils Relative: 0.5 % (ref 0.0–3.0)
Eosinophils Relative: 0.5 % (ref 0.0–5.0)
HCT: 41.5 % (ref 36.0–46.0)
Hemoglobin: 13.8 g/dL (ref 12.0–15.0)
Lymphs Abs: 3.5 10*3/uL (ref 0.7–4.0)
MCV: 83.2 fl (ref 78.0–100.0)
Monocytes Absolute: 0.5 10*3/uL (ref 0.1–1.0)
Monocytes Relative: 4.8 % (ref 3.0–12.0)
Neutro Abs: 6.3 10*3/uL (ref 1.4–7.7)
Platelets: 256 10*3/uL (ref 150.0–400.0)
RBC: 4.99 Mil/uL (ref 3.87–5.11)
WBC: 10.4 10*3/uL (ref 4.5–10.5)

## 2012-07-03 ENCOUNTER — Ambulatory Visit (INDEPENDENT_AMBULATORY_CARE_PROVIDER_SITE_OTHER): Payer: BC Managed Care – PPO | Admitting: Internal Medicine

## 2012-07-03 ENCOUNTER — Encounter: Payer: Self-pay | Admitting: Internal Medicine

## 2012-07-03 VITALS — BP 120/90 | HR 80 | Temp 99.3°F | Ht 60.0 in | Wt 171.0 lb

## 2012-07-03 DIAGNOSIS — Z0000 Encounter for general adult medical examination without abnormal findings: Secondary | ICD-10-CM

## 2012-07-03 DIAGNOSIS — E785 Hyperlipidemia, unspecified: Secondary | ICD-10-CM

## 2012-07-03 NOTE — Patient Instructions (Signed)
Weight loss and exercise weight loss of 10 pounds before next visit

## 2012-07-03 NOTE — Progress Notes (Signed)
Subjective:    Patient ID: Morgan Barker, female    DOB: 07-Aug-1965, 47 y.o.   MRN: [id number]  HPI  Provide your (female who presents for her yearly annual examination.  Blood pressure stable her current medications but she has gained weight and this is reflected in her lipid profile  Review of Systems  Constitutional: Negative for activity change, appetite change and fatigue.  HENT: Negative for ear pain, congestion, neck pain, postnasal drip and sinus pressure.   Eyes: Negative for redness and visual disturbance.  Respiratory: Negative for cough, shortness of breath and wheezing.   Gastrointestinal: Negative for abdominal pain and abdominal distention.  Genitourinary: Negative for dysuria, frequency and menstrual problem.  Musculoskeletal: Negative for myalgias, joint swelling and arthralgias.  Skin: Negative for rash and wound.  Neurological: Negative for dizziness, weakness and headaches.  Hematological: Negative for adenopathy. Does not bruise/bleed easily.  Psychiatric/Behavioral: Negative for sleep disturbance and decreased concentration.   Past Medical History  Diagnosis Date  . Anemia     History   Social History  . Marital Status: Single    Spouse Name: N/A    Number of Children: N/A  . Years of Education: N/A   Occupational History  . Not on file.   Social History Main Topics  . Smoking status: Never Smoker   . Smokeless tobacco: Not on file  . Alcohol Use: Not on file  . Drug Use: Not on file  . Sexually Active: Not on file   Other Topics Concern  . Not on file   Social History Narrative  . No narrative on file    Past Surgical History  Procedure Laterality Date  . Cholecystectomy      No family history on file.  No Known Allergies  Current Outpatient Prescriptions on File Prior to Visit  Medication Sig Dispense Refill  . losartan-hydrochlorothiazide (HYZAAR) 100-12.5 MG per tablet Take 1 tablet by mouth daily.  90 tablet  3  . Multiple  Vitamins-Iron (MULTIVITAMIN/IRON PO) Take by mouth daily.        . [DISCONTINUED] hydrochlorothiazide (HYDRODIURIL) 25 MG tablet Take 1 tablet (25 mg total) by mouth daily.  30 tablet  1   No current facility-administered medications on file prior to visit.    BP 120/90  Pulse 80  Temp(Src) 99.3 F (37.4 C) (Oral)  Ht 5' (1.524 m)  Wt 171 lb (77.565 kg)  BMI 33.4 kg/m2       Objective:   Physical Exam  Nursing note and vitals reviewed. Constitutional: She is oriented to person, place, and time. She appears well-developed and well-nourished. No distress.  HENT:  Head: Normocephalic and atraumatic.  Right Ear: External ear normal.  Left Ear: External ear normal.  Nose: Nose normal.  Mouth/Throat: Oropharynx is clear and moist.  Eyes: Conjunctivae and EOM are normal. Pupils are equal, round, and reactive to light.  Neck: Normal range of motion. Neck supple. No JVD present. No tracheal deviation present. No thyromegaly present.  Cardiovascular: Normal rate, regular rhythm, normal heart sounds and intact distal pulses.   No murmur heard. Pulmonary/Chest: Effort normal and breath sounds normal. She has no wheezes. She exhibits no tenderness.  Abdominal: Soft. Bowel sounds are normal.  Musculoskeletal: Normal range of motion. She exhibits no edema and no tenderness.  Lymphadenopathy:    She has no cervical adenopathy.  Neurological: She is alert and oriented to person, place, and time. She has normal reflexes. No cranial nerve deficit.  Skin: Skin is warm  and dry. She is not diaphoretic.  Psychiatric: She has a normal mood and affect. Her behavior is normal.          Assessment & Plan:   This is a routine physical examination for this healthy  Female. Reviewed all health maintenance protocols including mammography colonoscopy bone density and reviewed appropriate screening labs. Her immunization history was reviewed as well as her current medications and allergies refills of  her chronic medications were given and the plan for yearly health maintenance was discussed all orders and referrals were made as appropriate. Set goals for weight loss will monitor lipid profile  4 months

## 2012-08-12 ENCOUNTER — Other Ambulatory Visit: Payer: Self-pay | Admitting: Internal Medicine

## 2012-08-25 ENCOUNTER — Other Ambulatory Visit: Payer: Self-pay | Admitting: Obstetrics and Gynecology

## 2012-08-25 DIAGNOSIS — R928 Other abnormal and inconclusive findings on diagnostic imaging of breast: Secondary | ICD-10-CM

## 2012-09-05 ENCOUNTER — Ambulatory Visit
Admission: RE | Admit: 2012-09-05 | Discharge: 2012-09-05 | Disposition: A | Discharge status: Home / Self Care | Payer: BC Managed Care – PPO | Source: Ambulatory Visit | Attending: Obstetrics and Gynecology | Admitting: Obstetrics and Gynecology

## 2012-09-05 DIAGNOSIS — R928 Other abnormal and inconclusive findings on diagnostic imaging of breast: Secondary | ICD-10-CM

## 2012-09-20 ENCOUNTER — Other Ambulatory Visit: Payer: Self-pay | Admitting: Obstetrics and Gynecology

## 2012-11-01 ENCOUNTER — Other Ambulatory Visit: Payer: Self-pay | Admitting: Gastroenterology

## 2012-11-03 ENCOUNTER — Ambulatory Visit: Payer: BC Managed Care – PPO | Admitting: Internal Medicine

## 2012-11-16 ENCOUNTER — Encounter: Payer: Self-pay | Admitting: Internal Medicine

## 2013-02-01 ENCOUNTER — Telehealth: Payer: Self-pay | Admitting: Internal Medicine

## 2013-02-01 NOTE — Telephone Encounter (Signed)
Call back attempted at 1056 on 10-9, vmail left.
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
## 2013-05-15 ENCOUNTER — Ambulatory Visit (INDEPENDENT_AMBULATORY_CARE_PROVIDER_SITE_OTHER): Payer: BC Managed Care – PPO | Admitting: Family Medicine

## 2013-05-15 ENCOUNTER — Encounter: Payer: Self-pay | Admitting: Family Medicine

## 2013-05-15 VITALS — BP 142/84 | Temp 98.3°F | Wt 172.0 lb

## 2013-05-15 DIAGNOSIS — R2240 Localized swelling, mass and lump, unspecified lower limb: Secondary | ICD-10-CM

## 2013-05-15 DIAGNOSIS — R229 Localized swelling, mass and lump, unspecified: Secondary | ICD-10-CM

## 2013-05-15 MED ORDER — DOXYCYCLINE HYCLATE 100 MG PO TABS: 100.0000 mg | ORAL_TABLET | Freq: Two times a day (BID) | ORAL | Status: DC

## 2013-05-15 NOTE — Progress Notes (Signed)
Chief Complaint  Patient presents with  . bump on left foot/toe    HPI:  Acute visit for:  ? Toe infection: -started several months ago -growth on L second toe -a little pain recently  ROS: See pertinent positives and negatives per HPI.  Past Medical History  Diagnosis Date  . Anemia     Past Surgical History  Procedure Laterality Date  . Cholecystectomy      No family history on file.  History   Social History  . Marital Status: Single    Spouse Name: N/A    Number of Children: N/A  . Years of Education: N/A   Social History Main Topics  . Smoking status: Never Smoker   . Smokeless tobacco: None  . Alcohol Use: None  . Drug Use: None  . Sexual Activity: None   Other Topics Concern  . None   Social History Narrative  . None    Current outpatient prescriptions:losartan-hydrochlorothiazide (HYZAAR) 100-12.5 MG per tablet, TAKE 1 TABLET DAILY, Disp: 90 tablet, Rfl: 2;  Multiple Vitamins-Iron (MULTIVITAMIN/IRON PO), Take by mouth daily.  , Disp: , Rfl: ;  doxycycline (VIBRA-TABS) 100 MG tablet, Take 1 tablet (100 mg total) by mouth 2 (two) times daily., Disp: 20 tablet, Rfl: 0 [DISCONTINUED] hydrochlorothiazide (HYDRODIURIL) 25 MG tablet, Take 1 tablet (25 mg total) by mouth daily., Disp: 30 tablet, Rfl: 1  EXAM:  Filed Vitals:   05/15/13 1049  BP: 142/84  Temp: 98.3 F (36.8 C)    Body mass index is 33.59 kg/(m^2).  GENERAL: vitals reviewed and listed above, alert, oriented, appears well hydrated and in no acute distress  HEENT: atraumatic, conjunttiva clear, no obvious abnormalities on inspection of external nose and ears  NECK: no obvious masses on inspection  SKIN: L second digit pedunculated 8 mm round mass protruding from nail and lateral edge of toe, mild erythema surrounding nail  MS: moves all extremities without noticeable abnormality  PSYCH: pleasant and cooperative, no obvious depression or anxiety  ASSESSMENT AND PLAN:  Discussed  the following assessment and plan:  Mass of foot or toe - Plan: Ambulatory referral to Podiatry, doxycycline (VIBRA-TABS) 100 MG tablet  -we discussed possible serious and likely etiologies, workup and treatment, treatment risks and return precautions -after this discussion, Tayona opted for referral to podiatrist for treatment and abx after discussion risks -of course, we advised Karan  to return or notify a doctor immediately if symptoms worsen or persist or new concerns arise.  .  -Patient advised to return or notify a doctor immediately if symptoms worsen or persist or new concerns arise.  There are no Patient Instructions on file for this visit.   Colin Benton R.

## 2013-05-15 NOTE — Progress Notes (Signed)
Pre visit review using our clinic review tool, if applicable. No additional management support is needed unless otherwise documented below in the visit note. 

## 2013-05-17 ENCOUNTER — Other Ambulatory Visit: Payer: Self-pay | Admitting: Internal Medicine

## 2013-05-23 ENCOUNTER — Encounter: Payer: Self-pay | Admitting: Podiatrist

## 2013-05-23 ENCOUNTER — Ambulatory Visit (INDEPENDENT_AMBULATORY_CARE_PROVIDER_SITE_OTHER): Payer: BC Managed Care – PPO | Admitting: Podiatrist

## 2013-05-23 VITALS — BP 130/77 | HR 106 | Resp 16 | Ht 60.0 in | Wt 168.0 lb

## 2013-05-23 DIAGNOSIS — D492 Neoplasm of unspecified behavior of bone, soft tissue, and skin: Secondary | ICD-10-CM

## 2013-05-23 NOTE — Progress Notes (Signed)
   Subjective:    Patient ID: Morgan Barker, female    DOB: June 09, 1965, 48 y.o.   MRN: [id number]  HPI Comments: "I have this place on my toe"  Patient has large growth on 2nd toe left, medial side for several months.The area has gotten larger since she first noticed it. She has noticed recent pain. The area is red and has been draining. She saw her PCP, gave antibiotic and referred here.      Review of Systems  HENT: Positive for sinus pressure.   Respiratory: Positive for cough.   All other systems reviewed and are negative.       Objective:   Physical Exam GENERAL APPEARANCE: Alert, conversant. Appropriately groomed. No acute distress.  VASCULAR: Pedal pulses palpable and strong bilateral.  Capillary refill time is immediate to all digits,  Proximal to distal cooling it warm to warm.  Digital hair growth is present bilateral  NEUROLOGIC: sensation is intact epicritically and protectively to 5.07 monofilament at 5/5 sites bilateral.  Light touch is intact bilateral, vibratory sensation intact bilateral, achilles tendon reflex is intact bilateral.  MUSCULOSKELETAL: acceptable muscle strength, tone and stability bilateral.  Intrinsic muscluature intact bilateral.  Rectus appearance of foot and digits noted bilateral.   DERMATOLOGIC: bulbous Growth distal medial tip of the left 2nd toe.  Appears soft tissue in nature-  no redness, no swelling, no signs of infection are present. It  does have a stalk present which was removed appears to be soft tissue in nature. No pus, no purulence, no malodor is noted. Growth measures 5 mm in diameter and is circular.    Assessment & Plan:  Soft Tissue tumor left second toe  Plan: Recommended an anesthetizing the toe and removing the soft tissue and sending for pathology. This was accomplished today. The toe was anesthetized with lidocaine and Marcaine mixture after a sterile prep was performed. The toe was then exsanguinated from proximal to the lesion  to the base of the toe. The lesion was then sharply excised and sent for pathology. Antibiotic ointment and a dry sterile compressive dressing was applied. I will see her back in one week for followup and hopefully the results of the pathology will be back at that point. If she has any problems she is instructed to call.

## 2013-05-23 NOTE — Patient Instructions (Signed)

## 2013-06-01 ENCOUNTER — Encounter: Payer: Self-pay | Admitting: Podiatrist

## 2013-06-01 ENCOUNTER — Ambulatory Visit (INDEPENDENT_AMBULATORY_CARE_PROVIDER_SITE_OTHER): Payer: BC Managed Care – PPO | Admitting: Podiatrist

## 2013-06-01 VITALS — BP 110/67 | HR 86 | Resp 18

## 2013-06-01 DIAGNOSIS — D492 Neoplasm of unspecified behavior of bone, soft tissue, and skin: Secondary | ICD-10-CM

## 2013-06-01 NOTE — Progress Notes (Signed)
  Subjective:  Patient presents for follow up on lesion excision of the tip of the 2nd toe of the left foot she states it has been feeling much better-  She relates no drainage, no swelling , no pain.  Objective:  Neurovascular status is intact and unchanged to the left foot. Left second toe appears to be healing nicely. The lesion has been excised and is slightly pink where it was removed. Otherwise the area looks excellent.  Assessment: Status post excision of soft tissue lesion distal medial tip left second toe  Plan: Recommended continued use of a Band-Aid during the day and leave open to air at night. We will get the results of the pathology report and notify her when these become available. I discussed if it starts to grow back she is to let me know immediately we may have to do a deeper excision in the surgical suite which would require sutures.

## 2013-06-22 ENCOUNTER — Telehealth: Payer: Self-pay | Admitting: *Deleted

## 2013-06-22 NOTE — Telephone Encounter (Signed)
Dr Valentina Lucks states pt's 05/23/2013 biopsy left 2nd toe results as benign granuloma, and no further treatment is necessary unless returns.  I informed pt.

## 2013-06-29 ENCOUNTER — Other Ambulatory Visit (INDEPENDENT_AMBULATORY_CARE_PROVIDER_SITE_OTHER): Payer: BC Managed Care – PPO

## 2013-06-29 DIAGNOSIS — Z0000 Encounter for general adult medical examination without abnormal findings: Secondary | ICD-10-CM

## 2013-06-29 LAB — BASIC METABOLIC PANEL
BUN: 8 mg/dL (ref 6–23)
CALCIUM: 9.6 mg/dL (ref 8.4–10.5)
CO2: 26 meq/L (ref 19–32)
CREATININE: 0.6 mg/dL (ref 0.4–1.2)
Chloride: 106 mEq/L (ref 96–112)
GFR: 128.46 mL/min (ref >60.00)
Glucose, Bld: 83 mg/dL (ref 70–99)
Potassium: 3.8 mEq/L (ref 3.5–5.1)
Sodium: 139 mEq/L (ref 135–145)

## 2013-06-29 LAB — CBC WITH DIFFERENTIAL/PLATELET
BASOS PCT: 0.5 % (ref 0.0–3.0)
Basophils Absolute: 0 10*3/uL (ref 0.0–0.1)
EOS ABS: 0.1 10*3/uL (ref 0.0–0.7)
Eosinophils Relative: 0.6 % (ref 0.0–5.0)
HCT: 41.6 % (ref 36.0–46.0)
Hemoglobin: 13.4 g/dL (ref 12.0–15.0)
LYMPHS PCT: 30.7 % (ref 12.0–46.0)
Lymphs Abs: 3.1 10*3/uL (ref 0.7–4.0)
MCHC: 32.3 g/dL (ref 30.0–36.0)
MCV: 83.9 fl (ref 78.0–100.0)
Monocytes Absolute: 0.5 10*3/uL (ref 0.1–1.0)
Monocytes Relative: 4.8 % (ref 3.0–12.0)
NEUTROS PCT: 63.4 % (ref 43.0–77.0)
Neutro Abs: 6.4 10*3/uL (ref 1.4–7.7)
PLATELETS: 198 10*3/uL (ref 150.0–400.0)
RBC: 4.96 Mil/uL (ref 3.87–5.11)
RDW: 14 % (ref 11.5–14.6)
WBC: 10.1 10*3/uL (ref 4.5–10.5)

## 2013-06-29 LAB — POCT URINALYSIS DIPSTICK
BILIRUBIN UA: NEGATIVE
Blood, UA: NEGATIVE
GLUCOSE UA: NEGATIVE
KETONES UA: NEGATIVE
LEUKOCYTES UA: NEGATIVE
Nitrite, UA: NEGATIVE
PROTEIN UA: NEGATIVE
Urobilinogen, UA: 0.2
pH, UA: 6

## 2013-06-29 LAB — HEPATIC FUNCTION PANEL
ALK PHOS: 40 U/L (ref 39–117)
ALT: 28 U/L (ref 0–35)
AST: 19 U/L (ref 0–37)
Albumin: 4.1 g/dL (ref 3.5–5.2)
BILIRUBIN DIRECT: 0 mg/dL (ref 0.0–0.3)
BILIRUBIN TOTAL: 1 mg/dL (ref 0.3–1.2)
TOTAL PROTEIN: 7.5 g/dL (ref 6.0–8.3)

## 2013-06-29 LAB — TSH: TSH: 1.51 u[IU]/mL (ref 0.35–5.50)

## 2013-06-29 LAB — LIPID PANEL
CHOLESTEROL: 181 mg/dL (ref 0–200)
HDL: 35 mg/dL — AB (ref >39.00)
LDL CALC: 116 mg/dL — AB (ref 0–99)
TRIGLYCERIDES: 152 mg/dL — AB (ref 0.0–149.0)
Total CHOL/HDL Ratio: 5
VLDL: 30.4 mg/dL (ref 0.0–40.0)

## 2013-07-06 ENCOUNTER — Encounter: Payer: Self-pay | Admitting: Internal Medicine

## 2013-07-06 ENCOUNTER — Ambulatory Visit (INDEPENDENT_AMBULATORY_CARE_PROVIDER_SITE_OTHER): Payer: BC Managed Care – PPO | Admitting: Internal Medicine

## 2013-07-06 VITALS — BP 120/80 | HR 83 | Temp 97.9°F | Ht 60.0 in | Wt 172.0 lb

## 2013-07-06 DIAGNOSIS — Z0000 Encounter for general adult medical examination without abnormal findings: Secondary | ICD-10-CM

## 2013-07-06 DIAGNOSIS — E669 Obesity, unspecified: Secondary | ICD-10-CM | POA: Insufficient documentation

## 2013-07-06 DIAGNOSIS — I10 Essential (primary) hypertension: Secondary | ICD-10-CM

## 2013-07-06 MED ORDER — LOSARTAN POTASSIUM-HCTZ 100-12.5 MG PO TABS: ORAL_TABLET | ORAL | Status: DC

## 2013-07-06 NOTE — Patient Instructions (Signed)
The patient is instructed to continue all medications as prescribed. Schedule followup with check out clerk upon leaving the clinic  

## 2013-07-06 NOTE — Progress Notes (Signed)
Subjective:    Patient ID: Morgan Barker, female    DOB: 03-03-1966, 48 y.o.   MRN: [id number]  HPI Patient is a 48 year old female who presents for annual examination.  She is currently on a antihypertensive medication losartan with hydrochlorothiazide and has good blood pressure control. Flu shot at work    Review of Systems  Constitutional: Negative for activity change, appetite change and fatigue.  HENT: Negative for congestion, ear pain, postnasal drip and sinus pressure.   Eyes: Negative for redness and visual disturbance.  Respiratory: Negative for cough, shortness of breath and wheezing.   Gastrointestinal: Negative for abdominal pain and abdominal distention.  Genitourinary: Negative for dysuria, frequency and menstrual problem.  Musculoskeletal: Negative for arthralgias, joint swelling, myalgias and neck pain.  Skin: Negative for rash and wound.  Neurological: Negative for dizziness, weakness and headaches.  Hematological: Negative for adenopathy. Does not bruise/bleed easily.  Psychiatric/Behavioral: Negative for sleep disturbance and decreased concentration.   Past Medical History  Diagnosis Date  . Anemia     History   Social History  . Marital Status: Single    Spouse Name: N/A    Number of Children: N/A  . Years of Education: N/A   Occupational History  . Not on file.   Social History Main Topics  . Smoking status: Never Smoker   . Smokeless tobacco: Not on file  . Alcohol Use: Not on file  . Drug Use: Not on file  . Sexual Activity: Not on file   Other Topics Concern  . Not on file   Social History Narrative  . No narrative on file    Past Surgical History  Procedure Laterality Date  . Cholecystectomy      History reviewed. No pertinent family history.  No Known Allergies  Current Outpatient Prescriptions on File Prior to Visit  Medication Sig Dispense Refill  . losartan-hydrochlorothiazide (HYZAAR) 100-12.5 MG per tablet TAKE 1 TABLET  DAILY  90 tablet  1  . Multiple Vitamins-Iron (MULTIVITAMIN/IRON PO) Take by mouth daily.        . [DISCONTINUED] hydrochlorothiazide (HYDRODIURIL) 25 MG tablet Take 1 tablet (25 mg total) by mouth daily.  30 tablet  1   No current facility-administered medications on file prior to visit.    BP 120/80  Pulse 83  Temp(Src) 97.9 F (36.6 C) (Oral)  Ht 5' (1.524 m)  Wt 172 lb (78.019 kg)  BMI 33.59 kg/m2  SpO2 95%  LMP 06/29/2013       Objective:   Physical Exam  Nursing note and vitals reviewed. Constitutional: She is oriented to person, place, and time. She appears well-developed and well-nourished. No distress.  HENT:  Head: Normocephalic and atraumatic.  Right Ear: External ear normal.  Left Ear: External ear normal.  Nose: Nose normal.  Mouth/Throat: Oropharynx is clear and moist.  Eyes: Conjunctivae and EOM are normal. Pupils are equal, round, and reactive to light.  Neck: Normal range of motion. Neck supple. No JVD present. No tracheal deviation present. No thyromegaly present.  Cardiovascular: Normal rate, regular rhythm, normal heart sounds and intact distal pulses.   No murmur heard. Pulmonary/Chest: Effort normal and breath sounds normal. She has no wheezes. She exhibits no tenderness.  Abdominal: Soft. Bowel sounds are normal.  Musculoskeletal: Normal range of motion. She exhibits no edema and no tenderness.  Lymphadenopathy:    She has no cervical adenopathy.  Neurological: She is alert and oriented to person, place, and time. She has normal reflexes. No  cranial nerve deficit.  Skin: Skin is warm and dry. She is not diaphoretic.  Psychiatric: She has a normal mood and affect. Her behavior is normal.          Assessment & Plan:   This is a routine wellness  examination for this patient . I reviewed all health maintenance protocols including mammography, colonoscopy, bone density Needed referrals were placed. Age and diagnosis  appropriate screening labs were  ordered. Her immunization history was reviewed and appropriate vaccinations were ordered. Her current medications and allergies were reviewed and needed refills of her chronic medications were ordered. The plan for yearly health maintenance was discussed all orders and referrals were made as appropriate.

## 2013-07-06 NOTE — Progress Notes (Signed)
Pre visit review using our clinic review tool, if applicable. No additional management support is needed unless otherwise documented below in the visit note. 

## 2013-07-09 ENCOUNTER — Telehealth: Payer: Self-pay | Admitting: Internal Medicine

## 2013-07-09 NOTE — Telephone Encounter (Signed)
Relevant patient education assigned to patient using Emmi. ° °

## 2013-09-25 ENCOUNTER — Encounter: Payer: Self-pay | Admitting: Podiatrist

## 2013-11-01 ENCOUNTER — Other Ambulatory Visit: Payer: Self-pay | Admitting: Obstetrics and Gynecology

## 2014-03-18 ENCOUNTER — Other Ambulatory Visit: Payer: Self-pay | Admitting: Obstetrics and Gynecology

## 2014-03-19 LAB — CYTOLOGY - PAP

## 2014-06-20 ENCOUNTER — Ambulatory Visit (INDEPENDENT_AMBULATORY_CARE_PROVIDER_SITE_OTHER): Payer: BLUE CROSS/BLUE SHIELD | Admitting: Family Medicine

## 2014-06-20 ENCOUNTER — Encounter: Payer: Self-pay | Admitting: Family Medicine

## 2014-06-20 VITALS — BP 130/78 | Temp 98.4°F | Wt 176.0 lb

## 2014-06-20 DIAGNOSIS — Z86010 Personal history of colonic polyps: Secondary | ICD-10-CM | POA: Insufficient documentation

## 2014-06-20 DIAGNOSIS — Z8742 Personal history of other diseases of the female genital tract: Secondary | ICD-10-CM | POA: Insufficient documentation

## 2014-06-20 DIAGNOSIS — I10 Essential (primary) hypertension: Secondary | ICD-10-CM

## 2014-06-20 DIAGNOSIS — E669 Obesity, unspecified: Secondary | ICD-10-CM

## 2014-06-20 NOTE — Progress Notes (Signed)
Garret Reddish, MD Phone: [phone]  Subjective:  Patient presents today to establish care with me as their new primary care provider. Patient was formerly a patient of Dr. Arnoldo Morale. Chief complaint-noted.   Hypertension-controlled  BP Readings from Last 3 Encounters:  06/20/14 130/78  07/06/13 120/80  06/01/13 110/67  Home BP monitoring-no Compliant with medications-yes without side effects ROS-Denies any CP, HA, SOB, blurry vision, LE edema.   Obesity Stable over 3 years and actually has lost 5 lbs overall with some variability. Has used weight watchers in past. Describes weight gain when exercising due to increased appetite. ROS- no unintentional weight gain No heat/cold intolerance. No constipation or diarrhea. Denies shakiness or anxiety.   The following were reviewed and entered/updated in epic: Past Medical History  Diagnosis Date  . Anemia   . Ganglion of joint 03/17/2007    Removed through podiatry, may need repeat.      Patient Active Problem List   Diagnosis Date Noted  . HTN (hypertension) 07/02/2011    Priority: Medium  . History of colonic polyps 06/20/2014    Priority: Low  . History of abnormal cervical Pap smear 06/20/2014    Priority: Low  . Obesity (BMI 30-39.9) 07/06/2013    Priority: Low  . NECK AND BACK PAIN 12/11/2007    Priority: Low  . Alopecia 03/17/2007    Priority: Low   Past Surgical History  Procedure Laterality Date  . Cholecystectomy      Family History  Problem Relation Age of Onset  . Colon cancer Father     diagnosed 56  . Hypertension Mother     Medications- reviewed and updated Current Outpatient Prescriptions  Medication Sig Dispense Refill  . losartan-hydrochlorothiazide (HYZAAR) 100-12.5 MG per tablet TAKE 1 TABLET DAILY 90 tablet 3  . Multiple Vitamins-Iron (MULTIVITAMIN/IRON PO) Take by mouth daily.       Allergies-reviewed and updated No Known Allergies  History   Social History  . Marital Status: Single      Spouse Name: N/A  . Number of Children: N/A  . Years of Education: N/A   Social History Main Topics  . Smoking status: Never Smoker   . Smokeless tobacco: Not on file  . Alcohol Use: No  . Drug Use: No  . Sexual Activity: Not on file   Other Topics Concern  . None   Social History Narrative   Single. No children. Mother lives with patient.       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 Oropharynx normal CV: RRR no murmurs rubs or gallops Lungs: CTAB no crackles, wheeze, rhonchi Abdomen: soft/nontender/nondistended/normal bowel sounds. No rebound or guarding.  Ext: trace edema Skin: warm, dry, no rash Neuro: grossly normal, moves all extremities, PERRLA   Assessment/Plan:  HTN (hypertension) Controlled on Losartan-hctz 100-12.5 mg. Discussed we need to work hard on weight to help prevent need for additional BP medications in future as well as DM risk/need for lipid medication   Obesity (BMI 30-39.9) Discussion today >15 minutes focused on finding ways to help patient lose weight. Patient is very concerned about increased appetite with exercise so we focused on changing food choices. Ultimately decided weight watchers is probably best bet. Also advised low processed foods, increased fruits and  veggies. She has a strong sweet tooth and we discussed using natural fruits instead.    CPE in June or July. Weight goal closer to 150 would like to get BMI into overweight range.  >50% of 25 minute office visit was spent on counseling (weight loss, healthy lifestyles) and coordination of care

## 2014-06-20 NOTE — Patient Instructions (Addendum)
Blood Pressure looks great  I think the weight watchers idea is great. Would love to see your weight closer to 150 just to prevent risk for cholesterol and diabetes in the long run or additional BP meds.   Let's see each other in June or July for a physical and get labs 1 week before (standard labs are fine).

## 2014-06-20 NOTE — Assessment & Plan Note (Signed)
Discussion today >15 minutes focused on finding ways to help patient lose weight. Patient is very concerned about increased appetite with exercise so we focused on changing food choices. Ultimately decided weight watchers is probably best bet. Also advised low processed foods, increased fruits and veggies. She has a strong sweet tooth and we discussed using natural fruits instead.

## 2014-06-20 NOTE — Assessment & Plan Note (Signed)
Controlled on Losartan-hctz 100-12.5 mg. Discussed we need to work hard on weight to help prevent need for additional BP medications in future as well as DM risk/need for lipid medication

## 2014-06-25 ENCOUNTER — Telehealth: Payer: Self-pay

## 2014-06-25 MED ORDER — LOSARTAN POTASSIUM-HCTZ 100-12.5 MG PO TABS: ORAL_TABLET | ORAL | Status: DC

## 2014-06-25 NOTE — Telephone Encounter (Signed)
[facility] refill request for LOSARTAN-HCTZ 100-12.5MG . #90 WITH 3 refills

## 2014-06-25 NOTE — Telephone Encounter (Signed)
Medication refilled

## 2014-07-01 ENCOUNTER — Other Ambulatory Visit: Payer: BC Managed Care – PPO

## 2014-07-08 ENCOUNTER — Encounter: Payer: BC Managed Care – PPO | Admitting: Internal Medicine

## 2014-09-02 ENCOUNTER — Other Ambulatory Visit (HOSPITAL_COMMUNITY): Payer: Self-pay | Admitting: Obstetrics and Gynecology

## 2014-09-04 LAB — CYTOLOGY - PAP

## 2014-10-08 ENCOUNTER — Other Ambulatory Visit (INDEPENDENT_AMBULATORY_CARE_PROVIDER_SITE_OTHER): Payer: BLUE CROSS/BLUE SHIELD

## 2014-10-08 DIAGNOSIS — Z0000 Encounter for general adult medical examination without abnormal findings: Secondary | ICD-10-CM | POA: Diagnosis not present

## 2014-10-08 DIAGNOSIS — R7989 Other specified abnormal findings of blood chemistry: Secondary | ICD-10-CM

## 2014-10-08 LAB — BASIC METABOLIC PANEL
BUN: 14 mg/dL (ref 6–23)
CALCIUM: 9.8 mg/dL (ref 8.4–10.5)
CO2: 26 meq/L (ref 19–32)
CREATININE: 0.72 mg/dL (ref 0.40–1.20)
Chloride: 101 mEq/L (ref 96–112)
GFR: 111.51 mL/min (ref >60.00)
GLUCOSE: 93 mg/dL (ref 70–99)
Potassium: 3.7 mEq/L (ref 3.5–5.1)
SODIUM: 134 meq/L — AB (ref 135–145)

## 2014-10-08 LAB — CBC WITH DIFFERENTIAL/PLATELET
BASOS ABS: 0 10*3/uL (ref 0.0–0.1)
Basophils Relative: 0.4 % (ref 0.0–3.0)
EOS PCT: 0.6 % (ref 0.0–5.0)
Eosinophils Absolute: 0.1 10*3/uL (ref 0.0–0.7)
HCT: 43.2 % (ref 36.0–46.0)
Hemoglobin: 14 g/dL (ref 12.0–15.0)
Lymphocytes Relative: 33.3 % (ref 12.0–46.0)
Lymphs Abs: 3.6 10*3/uL (ref 0.7–4.0)
MCHC: 32.3 g/dL (ref 30.0–36.0)
MCV: 84.4 fl (ref 78.0–100.0)
MONO ABS: 0.5 10*3/uL (ref 0.1–1.0)
MONOS PCT: 4.9 % (ref 3.0–12.0)
NEUTROS PCT: 60.8 % (ref 43.0–77.0)
Neutro Abs: 6.6 10*3/uL (ref 1.4–7.7)
PLATELETS: 275 10*3/uL (ref 150.0–400.0)
RBC: 5.12 Mil/uL — ABNORMAL HIGH (ref 3.87–5.11)
RDW: 14 % (ref 11.5–15.5)
WBC: 10.8 10*3/uL — AB (ref 4.0–10.5)

## 2014-10-08 LAB — POCT URINALYSIS DIPSTICK
Bilirubin, UA: NEGATIVE
Blood, UA: NEGATIVE
GLUCOSE UA: NEGATIVE
Ketones, UA: NEGATIVE
Leukocytes, UA: NEGATIVE
Nitrite, UA: NEGATIVE
PROTEIN UA: NEGATIVE
Spec Grav, UA: <=1.005
Urobilinogen, UA: 0.2
pH, UA: 5

## 2014-10-08 LAB — HEPATIC FUNCTION PANEL
ALK PHOS: 48 U/L (ref 39–117)
ALT: 23 U/L (ref 0–35)
AST: 15 U/L (ref 0–37)
Albumin: 4.4 g/dL (ref 3.5–5.2)
BILIRUBIN DIRECT: 0.1 mg/dL (ref 0.0–0.3)
BILIRUBIN TOTAL: 0.7 mg/dL (ref 0.2–1.2)
Total Protein: 7.7 g/dL (ref 6.0–8.3)

## 2014-10-08 LAB — TSH: TSH: 3.57 u[IU]/mL (ref 0.35–4.50)

## 2014-10-08 LAB — LIPID PANEL
Cholesterol: 193 mg/dL (ref 0–200)
HDL: 33.5 mg/dL — AB (ref >39.00)
NONHDL: 159.5
Total CHOL/HDL Ratio: 6
Triglycerides: 225 mg/dL — ABNORMAL HIGH (ref 0.0–149.0)
VLDL: 45 mg/dL — ABNORMAL HIGH (ref 0.0–40.0)

## 2014-10-08 LAB — LDL CHOLESTEROL, DIRECT: Direct LDL: 102 mg/dL

## 2014-10-15 ENCOUNTER — Ambulatory Visit (INDEPENDENT_AMBULATORY_CARE_PROVIDER_SITE_OTHER): Payer: BLUE CROSS/BLUE SHIELD | Admitting: Family Medicine

## 2014-10-15 ENCOUNTER — Encounter: Payer: Self-pay | Admitting: Family Medicine

## 2014-10-15 VITALS — BP 120/88 | HR 88 | Temp 98.6°F | Ht 60.0 in | Wt 176.0 lb

## 2014-10-15 DIAGNOSIS — Z0000 Encounter for general adult medical examination without abnormal findings: Secondary | ICD-10-CM

## 2014-10-15 DIAGNOSIS — I10 Essential (primary) hypertension: Secondary | ICD-10-CM | POA: Diagnosis not present

## 2014-10-15 DIAGNOSIS — E785 Hyperlipidemia, unspecified: Secondary | ICD-10-CM | POA: Insufficient documentation

## 2014-10-15 NOTE — Assessment & Plan Note (Signed)
S: excellent control on losartan-hctz A/P: continue current meds

## 2014-10-15 NOTE — Progress Notes (Signed)
Morgan Reddish, MD Phone: [phone]  Subjective:  Patient presents today for their annual physical. Chief complaint-noted.   L shoulder, fingers asleep at night, worse on left hand first 4 fingers, sometimes bothers her in the day  Congested, watery, itchy eyes, not as much rhinorrhea   Pain under right rib cage radiates to back at times, usually after meals several years, worse before gallbladder removed  ROS- full  review of systems was completed and negative except for as noted above.   The following were reviewed and entered/updated in epic: Past Medical History  Diagnosis Date  . Anemia   . Ganglion of joint 03/17/2007    Removed through podiatry, may need repeat.      Patient Active Problem List   Diagnosis Date Noted  . Hyperlipidemia 10/15/2014    Priority: Medium  . HTN (hypertension) 07/02/2011    Priority: Medium  . History of colonic polyps 06/20/2014    Priority: Low  . History of abnormal cervical Pap smear 06/20/2014    Priority: Low  . Obesity (BMI 30-39.9) 07/06/2013    Priority: Low  . NECK AND BACK PAIN 12/11/2007    Priority: Low  . Alopecia 03/17/2007    Priority: Low   Past Surgical History  Procedure Laterality Date  . Cholecystectomy      Family History  Problem Relation Age of Onset  . Colon cancer Father     diagnosed 29  . Hypertension Mother     Medications- reviewed and updated Current Outpatient Prescriptions  Medication Sig Dispense Refill  . losartan-hydrochlorothiazide (HYZAAR) 100-12.5 MG per tablet TAKE 1 TABLET DAILY 90 tablet 3  . Multiple Vitamins-Iron (MULTIVITAMIN/IRON PO) Take by mouth daily.      . [DISCONTINUED] hydrochlorothiazide (HYDRODIURIL) 25 MG tablet Take 1 tablet (25 mg total) by mouth daily. 30 tablet 1   No current facility-administered medications for this visit.    Allergies-reviewed and updated No Known Allergies  History   Social History  . Marital Status: Single    Spouse Name: N/A  .  Number of Children: N/A  . Years of Education: N/A   Social History Main Topics  . Smoking status: Never Smoker   . Smokeless tobacco: Not on file  . Alcohol Use: No  . Drug Use: No  . Sexual Activity: Not on file   Other Topics Concern  . None   Social History Narrative   Single. No children. Mother lives with patient.       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 Oropharynx normal Breast and GYn exam done by gyn within 2 months Neck: no thyromegaly CV: RRR no murmurs rubs or gallops Lungs: CTAB no crackles, wheeze, rhonchi Abdomen: soft/nontender/nondistended/normal bowel sounds. No rebound or guarding.  Ext: no edema Skin: warm, dry Neuro: grossly normal, moves all extremities, PERRLA  Positive phalen's and tinel's  Left Shoulder: Inspection reveals no abnormalities, atrophy or asymmetry. Palpation is normal with no tenderness over AC joint or bicipital groove. ROM is full in all planes. Rotator cuff strength normal throughout. signs of impingement with  Hawkin's tests.. Negative empty can and neer.   painful arc noted but no drop arm sign.  Negative spurlings   Assessment/Plan:  49 y.o. female presenting for annual physical.  Health Maintenance counseling: 1. Anticipatory guidance: Patient counseled regarding regular dental exams, wearing seatbelts, wearing sunscreen 2. Risk factor reduction:  Advised patient of need for regular exercise (walking on weekends, taking stairs at work-advised to bump) and diet rich and fruits and vegetables to reduce risk of heart attack and stroke.  3. Immunizations/screenings/ancillary studies- mammograms yearly since 30-Jul-2038, pap earlier this year (history of abnormal so  under 6 month surveillance) Health Maintenance Due  Topic Date Due  . HIV Screening - next bloodwork 06/17/1982   Carpal tunnel- home exercise program and cock up wrist splint advised  L shoulder has suggestion rotator cuff injury (but could also be from neck). We will trial 1 week NSAID to help with carpal tunnel and potential rotator cuff and home exercise program. Return if no improvement  Trial per AVS allergic rhinitis meds  Also see AVS for monitoring advisement for rib pain  Hyperlipidemia S: poor control Lab Results  Component Value Date   CHOL 193 10/08/2014   HDL 33.50* 10/08/2014   LDLCALC 116* 06/29/2013   LDLDIRECT 102.0 10/08/2014   TRIG 225.0* 10/08/2014   CHOLHDL 6 10/08/2014  A/P: trig >200, LDL >100. We advised weight target of 150, if not making progress in 1 year, calculate 10 year risk and consider statin.    HTN (hypertension) S: excellent control on losartan-hctz A/P: continue current meds   6 months weight and BP check

## 2014-10-15 NOTE — Patient Instructions (Addendum)
Cholesterol is too high in regards to bad cholesterol/LDL as well as triglycerides at 225. Could start statin but we opted to work towards previous weight goal of 150 and recheck in a year before starting.  Left shoulder- do home exercise program 3x a week for a month then once a week at least for maintenance. If symptoms worsen or do not improve return to care  Start out with left wrist brace to see if that helps with carpal tunnel. You can ice the wrist as needed. Could also try aleve twice a day for 7 days to try to calm inflammation  Let's trial flonase to start for the congestion, watery eyes issues. If no improvement 2-3 weeks then can trial allegra or zyrtec. If doesn't work within 2-3 weeks, use both together. See me if still need help.   See me if you have worsening symptoms under the rib cage  Let's follow up 6 months for weight and BP check

## 2014-10-15 NOTE — Assessment & Plan Note (Signed)
S: poor control Lab Results  Component Value Date   CHOL 193 10/08/2014   HDL 33.50* 10/08/2014   LDLCALC 116* 06/29/2013   LDLDIRECT 102.0 10/08/2014   TRIG 225.0* 10/08/2014   CHOLHDL 6 10/08/2014  A/P: trig >200, LDL >100. We advised weight target of 150, if not making progress in 1 year, calculate 10 year risk and consider statin.

## 2015-04-16 ENCOUNTER — Ambulatory Visit (INDEPENDENT_AMBULATORY_CARE_PROVIDER_SITE_OTHER): Payer: BLUE CROSS/BLUE SHIELD | Admitting: Family Medicine

## 2015-04-16 ENCOUNTER — Encounter: Payer: Self-pay | Admitting: Family Medicine

## 2015-04-16 VITALS — BP 116/72 | Temp 98.6°F | Wt 172.0 lb

## 2015-04-16 DIAGNOSIS — I10 Essential (primary) hypertension: Secondary | ICD-10-CM

## 2015-04-16 DIAGNOSIS — E785 Hyperlipidemia, unspecified: Secondary | ICD-10-CM | POA: Diagnosis not present

## 2015-04-16 NOTE — Progress Notes (Signed)
Pre visit review using our clinic review tool, if applicable. No additional management support is needed unless otherwise documented below in the visit note. 

## 2015-04-16 NOTE — Progress Notes (Signed)
Garret Reddish, MD  Subjective:  Morgan Barker is a 49 y.o. year old very pleasant female patient who presents for/with See problem oriented charting ROS- No chest pain or shortness of breath. No headache or blurry vision.   Past Medical History-  Patient Active Problem List   Diagnosis Date Noted  . Hyperlipidemia 10/15/2014    Priority: Medium  . HTN (hypertension) 07/02/2011    Priority: Medium  . History of colonic polyps 06/20/2014    Priority: Low  . History of abnormal cervical Pap smear 06/20/2014    Priority: Low  . Obesity (BMI 30-39.9) 07/06/2013    Priority: Low  . NECK AND BACK PAIN 12/11/2007    Priority: Low  . Alopecia 03/17/2007    Priority: Low    Medications- reviewed and updated Current Outpatient Prescriptions  Medication Sig Dispense Refill  . losartan-hydrochlorothiazide (HYZAAR) 100-12.5 MG per tablet TAKE 1 TABLET DAILY 90 tablet 3  . Multiple Vitamins-Iron (MULTIVITAMIN/IRON PO) Take by mouth daily.       Objective: BP 116/72 mmHg  Temp(Src) 98.6 F (37 C) (Oral)  Wt 172 lb (78.019 kg) Gen: NAD, resting comfortably CV: RRR no murmurs rubs or gallops Lungs: CTAB no crackles, wheeze, rhonchi Abdomen: soft/nontender/nondistended/normal bowel sounds. No rebound or guarding.  Ext: no edema Skin: warm, dry Neuro: grossly normal, moves all extremities  Assessment/Plan:  Hyperlipidemia S: mild poorly controlled on no medicine. Focusing on lifestyle changes- down 4 lbs. Had been down 10 lbs but some gaining back as has gotten colder and around holidays  Lab Results  Component Value Date   CHOL 193 10/08/2014   HDL 33.50* 10/08/2014   LDLCALC 116* 06/29/2013   LDLDIRECT 102.0 10/08/2014   TRIG 225.0* 10/08/2014   CHOLHDL 6 10/08/2014   A/P: encouraged patient to continue lifestyle changes. Goal 15 lbs down but focus more on doing the right thing than losing the weight.     HTN (hypertension) S: controlled. Losartan-hctz 100-12.5 mg. Improved  from last visit with 4 lbs down BP Readings from Last 3 Encounters:  04/16/15 116/72  10/15/14 120/88  06/20/14 130/78  A/P:Continue current meds:  Dash type diet and regular exercise also important.    Carpal tunnel- splint helpful- will continue.  L rotator cuff-doing better with HEP- continue at least once a week  6 month CPE and asked patient to get HIV along with labs Return precautions advised.

## 2015-04-16 NOTE — Patient Instructions (Signed)
Great job losing 4 lbs Wt Readings from Last 3 Encounters:  04/16/15 172 lb (78.019 kg)  10/15/14 176 lb (79.833 kg)  06/20/14 176 lb (79.833 kg)   Blood pressure is looking better with positive weight changes BP Readings from Last 3 Encounters:  04/16/15 116/72  10/15/14 120/88  06/20/14 130/78   Carpal tunnel- continue braces at night  See me in 6 months for physical Ask lab to add on at that time Health Maintenance Due  Topic Date Due  . HIV Screening  06/17/1982

## 2015-04-16 NOTE — Assessment & Plan Note (Signed)
S: mild poorly controlled on no medicine. Focusing on lifestyle changes- down 4 lbs. Had been down 10 lbs but some gaining back as has gotten colder and around holidays  Lab Results  Component Value Date   CHOL 193 10/08/2014   HDL 33.50* 10/08/2014   LDLCALC 116* 06/29/2013   LDLDIRECT 102.0 10/08/2014   TRIG 225.0* 10/08/2014   CHOLHDL 6 10/08/2014   A/P: encouraged patient to continue lifestyle changes. Goal 15 lbs down but focus more on doing the right thing than losing the weight.

## 2015-04-16 NOTE — Assessment & Plan Note (Signed)
S: controlled. Losartan-hctz 100-12.5 mg. Improved from last visit with 4 lbs down BP Readings from Last 3 Encounters:  04/16/15 116/72  10/15/14 120/88  06/20/14 130/78  A/P:Continue current meds:  Dash type diet and regular exercise also important.

## 2015-07-31 ENCOUNTER — Other Ambulatory Visit: Payer: Self-pay | Admitting: Family Medicine

## 2015-09-15 LAB — HM MAMMOGRAPHY

## 2015-10-08 ENCOUNTER — Other Ambulatory Visit (INDEPENDENT_AMBULATORY_CARE_PROVIDER_SITE_OTHER): Payer: BLUE CROSS/BLUE SHIELD

## 2015-10-08 DIAGNOSIS — Z0000 Encounter for general adult medical examination without abnormal findings: Secondary | ICD-10-CM | POA: Diagnosis not present

## 2015-10-08 LAB — LIPID PANEL
CHOL/HDL RATIO: 5
Cholesterol: 185 mg/dL (ref 0–200)
HDL: 36 mg/dL — ABNORMAL LOW (ref >39.00)
LDL CALC: 124 mg/dL — AB (ref 0–99)
NonHDL: 149.24
TRIGLYCERIDES: 124 mg/dL (ref 0.0–149.0)
VLDL: 24.8 mg/dL (ref 0.0–40.0)

## 2015-10-08 LAB — CBC WITH DIFFERENTIAL/PLATELET
BASOS ABS: 0.1 10*3/uL (ref 0.0–0.1)
Basophils Relative: 0.5 % (ref 0.0–3.0)
EOS ABS: 0.1 10*3/uL (ref 0.0–0.7)
Eosinophils Relative: 0.7 % (ref 0.0–5.0)
HCT: 41.8 % (ref 36.0–46.0)
HEMOGLOBIN: 13.7 g/dL (ref 12.0–15.0)
LYMPHS ABS: 3.2 10*3/uL (ref 0.7–4.0)
Lymphocytes Relative: 29.1 % (ref 12.0–46.0)
MCHC: 32.8 g/dL (ref 30.0–36.0)
MCV: 82.9 fl (ref 78.0–100.0)
MONO ABS: 0.7 10*3/uL (ref 0.1–1.0)
Monocytes Relative: 6.3 % (ref 3.0–12.0)
NEUTROS PCT: 63.4 % (ref 43.0–77.0)
Neutro Abs: 6.9 10*3/uL (ref 1.4–7.7)
Platelets: 282 10*3/uL (ref 150.0–400.0)
RBC: 5.05 Mil/uL (ref 3.87–5.11)
RDW: 13.9 % (ref 11.5–15.5)
WBC: 10.8 10*3/uL — AB (ref 4.0–10.5)

## 2015-10-08 LAB — BASIC METABOLIC PANEL
BUN: 12 mg/dL (ref 6–23)
CALCIUM: 9.6 mg/dL (ref 8.4–10.5)
CO2: 25 mEq/L (ref 19–32)
CREATININE: 0.69 mg/dL (ref 0.40–1.20)
Chloride: 101 mEq/L (ref 96–112)
GFR: 116.63 mL/min (ref >60.00)
Glucose, Bld: 97 mg/dL (ref 70–99)
POTASSIUM: 3.4 meq/L — AB (ref 3.5–5.1)
Sodium: 136 mEq/L (ref 135–145)

## 2015-10-08 LAB — POC URINALSYSI DIPSTICK (AUTOMATED)
BILIRUBIN UA: NEGATIVE
Blood, UA: NEGATIVE
Glucose, UA: NEGATIVE
KETONES UA: NEGATIVE
LEUKOCYTES UA: NEGATIVE
Nitrite, UA: NEGATIVE
PROTEIN UA: NEGATIVE
Urobilinogen, UA: 0.2
pH, UA: 5.5

## 2015-10-08 LAB — HEPATIC FUNCTION PANEL
ALT: 21 U/L (ref 0–35)
AST: 16 U/L (ref 0–37)
Albumin: 4.4 g/dL (ref 3.5–5.2)
Alkaline Phosphatase: 46 U/L (ref 39–117)
BILIRUBIN DIRECT: 0.1 mg/dL (ref 0.0–0.3)
BILIRUBIN TOTAL: 1 mg/dL (ref 0.2–1.2)
Total Protein: 7.8 g/dL (ref 6.0–8.3)

## 2015-10-08 LAB — TSH: TSH: 3.38 u[IU]/mL (ref 0.35–4.50)

## 2015-10-09 LAB — HIV ANTIBODY (ROUTINE TESTING W REFLEX): HIV: NONREACTIVE

## 2015-10-16 ENCOUNTER — Encounter: Payer: BLUE CROSS/BLUE SHIELD | Admitting: Family Medicine

## 2015-12-18 NOTE — H&P (Signed)
  Patient name Imperato, Legna DICTATION# [id number] CSN# [id number]  Darlyn Chamber, MD 12/18/2015 11:21 AM

## 2015-12-18 NOTE — H&P (Signed)
NAMEEZTLI, DESO NO.:  [id number]  MEDICAL RECORD NO.:  [id number]  LOCATION:                                 FACILITY:  PHYSICIAN:  Darlyn Chamber, M.D.        DATE OF BIRTH:  DATE OF ADMISSION: DATE OF DISCHARGE:                             HISTORY & PHYSICAL   Date of her surgery is September 14th.  She is going to be done at [facility] on [facility].  [place]:  The patient is a 50 year old nulligravida female, who was having trouble with abnormal uterine bleeding and excessive menstrual flow.  She underwent a hysteroscopy here in the office that showed a 2.2-cm endometrial thickness and she either had an intrauterine fibroid and/or polyp.  We tried to do an in-office hysteroscopy because of significant cervical stenosis, could not get the cervix dilate.  Of note, she had had a previous LEEP procedure.  So, now we are going to try to do this in the hospital setting.  ALLERGIES:  In terms of allergies, she has no known drug allergies.  MEDICATIONS:  She is on losartan with hydrochlorothiazide for hypertension.  PAST MEDICAL HISTORY:  She does have a history of hypertension.  PREVIOUS SURGICAL HISTORY:  She has had her gallbladder removed and a previous loop electrical excision procedure of the cervix.  SOCIAL HISTORY:  She has no tobacco or alcohol use.  FAMILY HISTORY:  Noncontributory.  REVIEW OF SYSTEMS:  Noncontributory.  PHYSICAL EXAMINATION:  VITAL SIGNS:  The patient is afebrile with stable vital signs. HEENT:  The patient is normocephalic.  Pupils are equal, round, reactive to light and accommodation.  Extraocular movements were intact.  Sclerae and conjunctivae were clear.  Oropharynx was clear. LUNGS:  Clear. CARDIOVASCULAR SYSTEM:  Regular rate.  No murmurs or gallops. ABDOMEN:  Benign.  No mass, organomegaly, or tenderness. PELVIC:  Normal external genitalia.  Vaginal cuff is clear.   Cervix unremarkable.  Uterus; normal size, shape and contour.  Adnexa free of mass or tenderness. EXTREMITIES:  Trace edema. NEUROLOGIC:  Grossly within normal limits.  IMPRESSION:  Abnormal uterine bleeding with evidence of endometrial polyp and/or fibroid.  PLAN:  The patient undergo hysteroscopy with MyoSure resection of the intrauterine process.  The nature of the procedure had discussed and the risks have been explained including the risk of infection.  The risk of vascular injury could lead to hemorrhage, requiring transfusion with the risk of AIDS or hepatitis.  Excessive bleeding could require hysterectomy.  There is a risk of perforation with injury to adjacent organs that could require laparoscopy, exploratory surgery for repair.  Risk of deep venous thrombosis and pulmonary embolus.  The patient expressed her understanding of indications and risks.     Darlyn Chamber, M.D.     JSM/MEDQ  D:  12/18/2015  T:  12/18/2015  Job:  [id number]

## 2016-01-02 ENCOUNTER — Encounter (HOSPITAL_BASED_OUTPATIENT_CLINIC_OR_DEPARTMENT_OTHER): Payer: Self-pay | Admitting: *Deleted

## 2016-01-05 ENCOUNTER — Encounter (HOSPITAL_BASED_OUTPATIENT_CLINIC_OR_DEPARTMENT_OTHER): Payer: Self-pay | Admitting: *Deleted

## 2016-01-05 NOTE — Progress Notes (Signed)
Pt instructed npo pmn 9/13 x pain med w sip of water if needed.  To [facility] 9/13 before 2 pm for cbc and serum pregnancy.  To [facility] 9/14 @ 0600.

## 2016-01-07 DIAGNOSIS — Z79899 Other long term (current) drug therapy: Secondary | ICD-10-CM | POA: Diagnosis not present

## 2016-01-07 DIAGNOSIS — N939 Abnormal uterine and vaginal bleeding, unspecified: Secondary | ICD-10-CM | POA: Diagnosis present

## 2016-01-07 DIAGNOSIS — I10 Essential (primary) hypertension: Secondary | ICD-10-CM | POA: Diagnosis not present

## 2016-01-07 DIAGNOSIS — N840 Polyp of corpus uteri: Secondary | ICD-10-CM | POA: Diagnosis not present

## 2016-01-07 LAB — HCG, SERUM, QUALITATIVE: PREG SERUM: NEGATIVE

## 2016-01-07 LAB — CBC
HEMATOCRIT: 41.7 % (ref 36.0–46.0)
Hemoglobin: 13.7 g/dL (ref 12.0–15.0)
MCH: 27.6 pg (ref 26.0–34.0)
MCHC: 32.9 g/dL (ref 30.0–36.0)
MCV: 83.9 fL (ref 78.0–100.0)
PLATELETS: 350 10*3/uL (ref 150–400)
RBC: 4.97 MIL/uL (ref 3.87–5.11)
RDW: 14.2 % (ref 11.5–15.5)
WBC: 10.8 10*3/uL — AB (ref 4.0–10.5)

## 2016-01-07 NOTE — Anesthesia Preprocedure Evaluation (Addendum)
Anesthesia Evaluation  Patient identified by MRN, date of birth, ID band Patient awake    Reviewed: Allergy & Precautions, NPO status , Patient's Chart, lab work & pertinent test results  Airway Mallampati: III  TM Distance: >3 FB Neck ROM: Full    Dental no notable dental hx.    Pulmonary neg pulmonary ROS,    Pulmonary exam normal        Cardiovascular hypertension, Pt. on medications negative cardio ROS Normal cardiovascular exam     Neuro/Psych negative neurological ROS  negative psych ROS   GI/Hepatic negative GI ROS, Neg liver ROS,   Endo/Other  negative endocrine ROS  Renal/GU negative Renal ROS   Abnormal uterine bleeding    Musculoskeletal negative musculoskeletal ROS (+)   Abdominal   Peds negative pediatric ROS (+)  Hematology negative hematology ROS (+)   Anesthesia Other Findings   Reproductive/Obstetrics negative OB ROS                            Anesthesia Physical Anesthesia Plan  ASA: II  Anesthesia Plan: General   Post-op Pain Management:    Induction: Intravenous  Airway Management Planned: LMA  Additional Equipment: None  Intra-op Plan:   Post-operative Plan: Extubation in OR  Informed Consent: I have reviewed the patients History and Physical, chart, labs and discussed the procedure including the risks, benefits and alternatives for the proposed anesthesia with the patient or authorized representative who has indicated his/her understanding and acceptance.   Dental advisory given  Plan Discussed with: CRNA  Anesthesia Plan Comments:        Anesthesia Quick Evaluation

## 2016-01-08 ENCOUNTER — Encounter (HOSPITAL_BASED_OUTPATIENT_CLINIC_OR_DEPARTMENT_OTHER): Payer: Self-pay | Admitting: *Deleted

## 2016-01-08 ENCOUNTER — Encounter (HOSPITAL_BASED_OUTPATIENT_CLINIC_OR_DEPARTMENT_OTHER)
Admission: RE | Disposition: A | Discharge status: Home / Self Care | Payer: Self-pay | Source: Ambulatory Visit | Attending: Obstetrics and Gynecology

## 2016-01-08 ENCOUNTER — Ambulatory Visit (HOSPITAL_BASED_OUTPATIENT_CLINIC_OR_DEPARTMENT_OTHER): Payer: BLUE CROSS/BLUE SHIELD | Admitting: Anesthesiology

## 2016-01-08 ENCOUNTER — Ambulatory Visit (HOSPITAL_BASED_OUTPATIENT_CLINIC_OR_DEPARTMENT_OTHER)
Admission: RE | Admit: 2016-01-08 | Discharge: 2016-01-08 | Disposition: A | Discharge status: Home / Self Care | Payer: BLUE CROSS/BLUE SHIELD | Source: Ambulatory Visit | Attending: Obstetrics and Gynecology | Admitting: Obstetrics and Gynecology

## 2016-01-08 DIAGNOSIS — N840 Polyp of corpus uteri: Secondary | ICD-10-CM | POA: Diagnosis not present

## 2016-01-08 DIAGNOSIS — I10 Essential (primary) hypertension: Secondary | ICD-10-CM | POA: Insufficient documentation

## 2016-01-08 DIAGNOSIS — Z79899 Other long term (current) drug therapy: Secondary | ICD-10-CM | POA: Insufficient documentation

## 2016-01-08 HISTORY — PX: DILATATION & CURETTAGE/HYSTEROSCOPY WITH MYOSURE: SHX6511

## 2016-01-08 HISTORY — DX: Essential (primary) hypertension: I10

## 2016-01-08 HISTORY — DX: Hyperlipidemia, unspecified: E78.5

## 2016-01-08 HISTORY — DX: Stricture and stenosis of cervix uteri: N88.2

## 2016-01-08 HISTORY — DX: Personal history of colon polyps, unspecified: Z86.0100

## 2016-01-08 HISTORY — DX: Unspecified right bundle-branch block: I45.10

## 2016-01-08 HISTORY — DX: Personal history of colonic polyps: Z86.010

## 2016-01-08 SURGERY — DILATATION & CURETTAGE/HYSTEROSCOPY WITH MYOSURE
Anesthesia: General | Site: Uterus

## 2016-01-08 MED ORDER — PROPOFOL 10 MG/ML IV BOLUS
INTRAVENOUS | Status: AC
  Filled 2016-01-08: qty 40

## 2016-01-08 MED ORDER — FENTANYL CITRATE (PF) 100 MCG/2ML IJ SOLN
INTRAMUSCULAR | Status: DC | PRN
  Administered 2016-01-08 (×2): 50 ug via INTRAVENOUS

## 2016-01-08 MED ORDER — SODIUM CHLORIDE 0.9 % IR SOLN
Status: DC | PRN
  Administered 2016-01-08: 2000 mL

## 2016-01-08 MED ORDER — LACTATED RINGERS IV SOLN
INTRAVENOUS | Status: DC
  Administered 2016-01-08: 07:00:00 via INTRAVENOUS
  Filled 2016-01-08: qty 1000

## 2016-01-08 MED ORDER — CEFAZOLIN SODIUM-DEXTROSE 2-4 GM/100ML-% IV SOLN
2.0000 g | INTRAVENOUS | Status: AC
  Administered 2016-01-08: 2 g via INTRAVENOUS
  Filled 2016-01-08: qty 100

## 2016-01-08 MED ORDER — ONDANSETRON HCL 4 MG/2ML IJ SOLN
INTRAMUSCULAR | Status: AC
  Filled 2016-01-08: qty 2

## 2016-01-08 MED ORDER — OXYCODONE-ACETAMINOPHEN 7.5-325 MG PO TABS: 1.0000 | ORAL_TABLET | ORAL | 0 refills | Status: DC | PRN

## 2016-01-08 MED ORDER — KETOROLAC TROMETHAMINE 30 MG/ML IJ SOLN
INTRAMUSCULAR | Status: AC
  Filled 2016-01-08: qty 1

## 2016-01-08 MED ORDER — PROMETHAZINE HCL 25 MG/ML IJ SOLN
6.2500 mg | INTRAMUSCULAR | Status: DC | PRN
Start: 2016-01-08 — End: 2016-01-08
  Filled 2016-01-08: qty 1

## 2016-01-08 MED ORDER — ARTIFICIAL TEARS OP OINT
TOPICAL_OINTMENT | OPHTHALMIC | Status: AC
  Filled 2016-01-08: qty 3.5

## 2016-01-08 MED ORDER — MIDAZOLAM HCL 2 MG/2ML IJ SOLN
INTRAMUSCULAR | Status: AC
  Filled 2016-01-08: qty 2

## 2016-01-08 MED ORDER — FENTANYL CITRATE (PF) 100 MCG/2ML IJ SOLN
25.0000 ug | INTRAMUSCULAR | Status: DC | PRN
  Administered 2016-01-08: 25 ug via INTRAVENOUS
  Filled 2016-01-08: qty 1

## 2016-01-08 MED ORDER — PROPOFOL 10 MG/ML IV BOLUS
INTRAVENOUS | Status: DC | PRN
  Administered 2016-01-08: 150 mg via INTRAVENOUS
  Administered 2016-01-08: 20 mg via INTRAVENOUS

## 2016-01-08 MED ORDER — KETOROLAC TROMETHAMINE 30 MG/ML IJ SOLN
INTRAMUSCULAR | Status: DC | PRN
  Administered 2016-01-08: 30 mg via INTRAVENOUS

## 2016-01-08 MED ORDER — LIDOCAINE 2% (20 MG/ML) 5 ML SYRINGE
INTRAMUSCULAR | Status: DC | PRN
  Administered 2016-01-08: 80 mg via INTRAVENOUS
  Administered 2016-01-08: 20 mg via INTRAVENOUS

## 2016-01-08 MED ORDER — DEXAMETHASONE SODIUM PHOSPHATE 10 MG/ML IJ SOLN
INTRAMUSCULAR | Status: AC
  Filled 2016-01-08: qty 1

## 2016-01-08 MED ORDER — MIDAZOLAM HCL 5 MG/5ML IJ SOLN
INTRAMUSCULAR | Status: DC | PRN
  Administered 2016-01-08: 2 mg via INTRAVENOUS

## 2016-01-08 MED ORDER — FENTANYL CITRATE (PF) 100 MCG/2ML IJ SOLN
INTRAMUSCULAR | Status: AC
  Filled 2016-01-08: qty 2

## 2016-01-08 MED ORDER — DEXAMETHASONE SODIUM PHOSPHATE 4 MG/ML IJ SOLN
INTRAMUSCULAR | Status: DC | PRN
  Administered 2016-01-08: 10 mg via INTRAVENOUS

## 2016-01-08 MED ORDER — LIDOCAINE-EPINEPHRINE 1 %-1:100000 IJ SOLN
INTRAMUSCULAR | Status: DC | PRN
  Administered 2016-01-08: 10 mL

## 2016-01-08 MED ORDER — CEFAZOLIN SODIUM-DEXTROSE 2-4 GM/100ML-% IV SOLN
INTRAVENOUS | Status: AC
  Filled 2016-01-08: qty 100

## 2016-01-08 MED ORDER — LIDOCAINE 2% (20 MG/ML) 5 ML SYRINGE
INTRAMUSCULAR | Status: AC
  Filled 2016-01-08: qty 5

## 2016-01-08 MED ORDER — ONDANSETRON HCL 4 MG/2ML IJ SOLN
INTRAMUSCULAR | Status: DC | PRN
  Administered 2016-01-08: 4 mg via INTRAVENOUS

## 2016-01-08 SURGICAL SUPPLY — 22 items
CANISTER SUCTION 2500CC (MISCELLANEOUS) ×5 IMPLANT
CATH ROBINSON RED A/P 16FR (CATHETERS) ×2 IMPLANT
COVER BACK TABLE 60X90IN (DRAPES) ×3 IMPLANT
DEVICE MYOSURE LITE (MISCELLANEOUS) ×2 IMPLANT
DRAPE LG THREE QUARTER DISP (DRAPES) ×3 IMPLANT
DRSG TELFA 3X8 NADH (GAUZE/BANDAGES/DRESSINGS) ×3 IMPLANT
GLOVE BIO SURGEON STRL SZ7 (GLOVE) ×6 IMPLANT
GOWN STRL REUS W/ TWL LRG LVL3 (GOWN DISPOSABLE) ×2 IMPLANT
GOWN STRL REUS W/TWL LRG LVL3 (GOWN DISPOSABLE) ×6
KIT ROOM TURNOVER WOR (KITS) ×3 IMPLANT
LEGGING LITHOTOMY PAIR STRL (DRAPES) ×3 IMPLANT
NDL SPNL 18GX3.5 QUINCKE PK (NEEDLE) ×1 IMPLANT
NEEDLE SPNL 18GX3.5 QUINCKE PK (NEEDLE) ×3 IMPLANT
PACK BASIN DAY SURGERY FS (CUSTOM PROCEDURE TRAY) ×3 IMPLANT
PAD DRESSING TELFA 3X8 NADH (GAUZE/BANDAGES/DRESSINGS) ×1 IMPLANT
PAD OB MATERNITY 4.3X12.25 (PERSONAL CARE ITEMS) ×3 IMPLANT
PAD PREP 24X48 CUFFED NSTRL (MISCELLANEOUS) ×3 IMPLANT
SEAL ROD LENS SCOPE MYOSURE (ABLATOR) ×3 IMPLANT
SYR CONTROL 10ML LL (SYRINGE) ×3 IMPLANT
TOWEL OR 17X24 6PK STRL BLUE (TOWEL DISPOSABLE) ×4 IMPLANT
TUBING AQUILEX INFLOW (TUBING) ×3 IMPLANT
TUBING AQUILEX OUTFLOW (TUBING) ×3 IMPLANT

## 2016-01-08 NOTE — Brief Op Note (Signed)
01/08/2016  8:03 AM  PATIENT:  Morgan Barker  50 y.o. female  PRE-OPERATIVE DIAGNOSIS:  POLYPS, STENOTIC CERVIX  POST-OPERATIVE DIAGNOSIS:  * No post-op diagnosis entered *  PROCEDURE:  Procedure(s) with comments: DILATATION & CURETTAGE/HYSTEROSCOPY WITH MYOSURE (N/A) - MYOSURE FOR POLYPS  SURGEON:  Surgeon(s) and Role:    * Arvella Nigh, MD - Primary  PHYSICIAN ASSISTANT:   ASSISTANTS: none   ANESTHESIA:   general and paracervical block  EBL:  Total I/O In: 200 [I.V.:200] Out: -   BLOOD ADMINISTERED:none  DRAINS: none   LOCAL MEDICATIONS USED:  XYLOCAINE   SPECIMEN:  Source of Specimen:  endometrial resections and currettings  DISPOSITION OF SPECIMEN:  PATHOLOGY  COUNTS:  YES  TOURNIQUET:  * No tourniquets in log *  DICTATION: .Other Dictation: Dictation Number [id number]  PLAN OF CARE: Discharge to home after PACU  PATIENT DISPOSITION:  PACU - hemodynamically stable.   Delay start of Pharmacological VTE agent (>24hrs) due to surgical blood loss or risk of bleeding: not applicable

## 2016-01-08 NOTE — Op Note (Signed)
NAMEMICKY, RODDA NO.:  [id number]  MEDICAL RECORD NO.:  [id number]  LOCATION:                                 FACILITY:  PHYSICIAN:  Darlyn Chamber, M.D.        DATE OF BIRTH:  DATE OF PROCEDURE:  01/08/2016 DATE OF DISCHARGE:                              OPERATIVE REPORT   PREOPERATIVE DIAGNOSES:  Abnormal uterine bleeding with evidence of endometrial buildup, possible polyps.  POSTOPERATIVE DIAGNOSES:  Abnormal uterine bleeding with evidence of endometrial buildup, possible polyps.  OPERATIVE PROCEDURES:  Paracervical block.  Cervical dilatation. Hysteroscopy.  Resection of endometrial polyps and endometrium. Endometrial curettings.  SURGEON:  Darlyn Chamber, M.D.  ANESTHESIA:  Paracervical block and general anesthesia.  BLOOD LOSS:  Minimal.  PACKS:  None.  DRAINS:  None.  INTRAOPERATIVE BLOOD PLACED:  None.  COMPLICATIONS:  None.  INDICATION:  Dictated in history and physical.  PROCEDURE IN DETAIL:  The patient was taken to the OR and placed in supine position.  After satisfactory level of general anesthesia was obtained, the patient was placed in the dorsal lithotomy position using the Allen stirrups.  The perineum and vagina were prepped out with Betadine.  Bladder was emptied by in-and-out catheterization.  The patient was draped out a sterile field.  Speculum was placed in the vaginal vault.  Cervix was grasped with Ardis Hughs tenaculum.  I could not get the uterine sound to enter.  I went ahead and used a hemostat, was able to enter the os, spread the hemostat apart; therefore, opening the cervical os.  We then able to successfully dilate the cervix to a size 29 Pratt dilator.  Hysteroscope was introduced.  Intrauterine cavity was distended using saline.  Visualization revealed some irregular thickening of the endometrium with polyp-like outgrows.  We brought in the small MyoSure.  All this abnormal endometrium was resected and  sent for pathology.  Then, did the resection, the endometrium was smooth with no abnormalities.  The hysteroscope was then removed.  Endometrial curettings were obtained.  At this point in time, there was minimal-to- no bleeding.  Fluid deficit was minimal.  The Jacobs tenaculum and speculum were removed.  The patient was taken out of the dorsal lithotomy position.  Once alert, was transferred to the recovery room in good condition.  Sponge, instrument and needle counts were reported as correct by circulating nurse x2.     Darlyn Chamber, M.D.     JSM/MEDQ  D:  01/08/2016  T:  01/08/2016  Job:  [id number]

## 2016-01-08 NOTE — Discharge Instructions (Signed)

## 2016-01-08 NOTE — Addendum Note (Signed)
Addendum  created 01/08/16 1031 by Mechele Claude, CRNA   Anesthesia Intra Flowsheets edited

## 2016-01-08 NOTE — H&P (Signed)
  History and physical exam unchanged 

## 2016-01-08 NOTE — Transfer of Care (Signed)
Last Vitals:  Vitals:   01/08/16 0637 01/08/16 0815  BP: 121/78 125/85  Pulse: 93 (!) 107  Resp: 18 20  Temp: 37.4 C 36.7 C    Last Pain:  Vitals:   01/08/16 0815  TempSrc:   PainSc: 4       Patients Stated Pain Goal: 7 (01/08/16 0606)  Immediate Anesthesia Transfer of Care Note  Patient: Morgan Barker  Procedure(s) Performed: Procedure(s) (LRB): DILATATION & CURETTAGE/HYSTEROSCOPY WITH MYOSURE (N/A)  Patient Location: PACU  Anesthesia Type: General  Level of Consciousness: awake, alert  and oriented  Airway & Oxygen Therapy: Patient Spontanous Breathing and Patient connected to nasal cannula oxygen  Post-op Assessment: Report given to PACU RN and Post -op Vital signs reviewed and stable  Post vital signs: Reviewed and stable  Complications: No apparent anesthesia complications

## 2016-01-08 NOTE — Anesthesia Postprocedure Evaluation (Signed)
Anesthesia Post Note  Patient: Morgan Barker  Procedure(s) Performed: Procedure(s) (LRB): DILATATION & CURETTAGE/HYSTEROSCOPY WITH MYOSURE (N/A)  Patient location during evaluation: PACU Anesthesia Type: General Level of consciousness: awake and alert Pain management: pain level controlled Vital Signs Assessment: post-procedure vital signs reviewed and stable Respiratory status: spontaneous breathing, nonlabored ventilation, respiratory function stable and patient connected to nasal cannula oxygen Cardiovascular status: blood pressure returned to baseline and stable Postop Assessment: no signs of nausea or vomiting Anesthetic complications: no    Last Vitals:  Vitals:   01/08/16 0845 01/08/16 0900  BP: 123/86 118/80  Pulse: 81 84  Resp: 20 20  Temp:  36.8 C    Last Pain:  Vitals:   01/08/16 0900  TempSrc:   PainSc: 1                  Jaysha Lasure Minda Meo

## 2016-01-08 NOTE — Anesthesia Procedure Notes (Signed)
Procedure Name: LMA Insertion Date/Time: 01/08/2016 7:40 AM Performed by: Reginal Lutes Pre-anesthesia Checklist: Patient identified, Emergency Drugs available, Suction available and Patient being monitored Patient Re-evaluated:Patient Re-evaluated prior to inductionOxygen Delivery Method: Circle system utilized Preoxygenation: Pre-oxygenation with 100% oxygen Intubation Type: IV induction Ventilation: Mask ventilation without difficulty LMA: LMA inserted LMA Size: 4.0 Number of attempts: 1 Airway Equipment and Method: Bite block Placement Confirmation: positive ETCO2 Tube secured with: Tape Dental Injury: Teeth and Oropharynx as per pre-operative assessment

## 2016-01-09 ENCOUNTER — Encounter (HOSPITAL_BASED_OUTPATIENT_CLINIC_OR_DEPARTMENT_OTHER): Payer: Self-pay | Admitting: Obstetrics and Gynecology

## 2016-01-21 ENCOUNTER — Ambulatory Visit: Payer: BLUE CROSS/BLUE SHIELD | Attending: Gynecologic Oncology | Admitting: Gynecologic Oncology

## 2016-01-21 ENCOUNTER — Encounter: Payer: Self-pay | Admitting: Gynecologic Oncology

## 2016-01-21 VITALS — BP 119/63 | HR 98 | Temp 98.4°F | Resp 18 | Wt 175.3 lb

## 2016-01-21 DIAGNOSIS — D649 Anemia, unspecified: Secondary | ICD-10-CM | POA: Insufficient documentation

## 2016-01-21 DIAGNOSIS — Z8249 Family history of ischemic heart disease and other diseases of the circulatory system: Secondary | ICD-10-CM | POA: Insufficient documentation

## 2016-01-21 DIAGNOSIS — N840 Polyp of corpus uteri: Secondary | ICD-10-CM | POA: Insufficient documentation

## 2016-01-21 DIAGNOSIS — I10 Essential (primary) hypertension: Secondary | ICD-10-CM | POA: Insufficient documentation

## 2016-01-21 DIAGNOSIS — N8501 Benign endometrial hyperplasia: Secondary | ICD-10-CM | POA: Insufficient documentation

## 2016-01-21 DIAGNOSIS — E785 Hyperlipidemia, unspecified: Secondary | ICD-10-CM | POA: Diagnosis not present

## 2016-01-21 DIAGNOSIS — N8500 Endometrial hyperplasia, unspecified: Secondary | ICD-10-CM | POA: Insufficient documentation

## 2016-01-21 DIAGNOSIS — I4510 Unspecified right bundle-branch block: Secondary | ICD-10-CM | POA: Insufficient documentation

## 2016-01-21 DIAGNOSIS — Z9049 Acquired absence of other specified parts of digestive tract: Secondary | ICD-10-CM | POA: Diagnosis not present

## 2016-01-21 DIAGNOSIS — M4802 Spinal stenosis, cervical region: Secondary | ICD-10-CM | POA: Insufficient documentation

## 2016-01-21 DIAGNOSIS — Z800 Family history of malignant neoplasm of digestive organs: Secondary | ICD-10-CM | POA: Insufficient documentation

## 2016-01-21 DIAGNOSIS — Z86010 Personal history of colonic polyps: Secondary | ICD-10-CM | POA: Diagnosis not present

## 2016-01-21 MED ORDER — MEDROXYPROGESTERONE ACETATE 10 MG PO TABS: 10.0000 mg | ORAL_TABLET | Freq: Every day | ORAL | 3 refills | Status: DC

## 2016-01-21 NOTE — Patient Instructions (Signed)
Take provera daily for 3 months. Repeat biopsy at that time.   Medroxyprogesterone tablets What is this medicine? MEDROXYPROGESTERONE (me DROX ee proe JES te rone) is a hormone in a class called progestins. It is commonly used to prevent the uterine lining from overgrowth. It is also used to treat irregular menstrual bleeding or a lack of menstrual bleeding in women. This medicine may be used for other purposes; ask your health care provider or pharmacist if you have questions.  What should I tell my health care provider before I take this medicine? They need to know if you have any of these conditions: -blood vessel disease or a history of a blood clot in the lungs or legs -breast, cervical or vaginal cancer -heart disease -kidney disease -liver disease -migraine -recent miscarriage or abortion -mental depression -migraine -seizures (convulsions) -stroke -vaginal bleeding that has not been evaluated -an unusual or allergic reaction to medroxyprogesterone, other medicines, foods, dyes, or preservatives -pregnant or trying to get pregnant -breast-feeding  How should I use this medicine? Take this medicine by mouth with a glass of water. Follow the directions on the prescription label. Take your doses at regular intervals. Do not take your medicine more often than directed. Talk to your pediatrician regarding the use of this medicine in children. Special care may be needed. While this drug may be prescribed for children as young as 13 years for selected conditions, precautions do apply. Overdosage: If you think you have taken too much of this medicine contact a poison control center or emergency room at once. NOTE: This medicine is only for you. Do not share this medicine with others. What if I miss a dose? If you miss a dose, take it as soon as you can. If it is almost time for your next dose, take only that dose. Do not take double or extra doses. What may interact with this  medicine? -barbiturate medicines for inducing sleep or treating seizures (convulsions) -bosentan -carbamazepine -phenytoin -rifampin -St. John's Wort This list may not describe all possible interactions. Give your health care provider a list of all the medicines, herbs, non-prescription drugs, or dietary supplements you use. Also tell them if you smoke, drink alcohol, or use illegal drugs. Some items may interact with your medicine. What should I watch for while using this medicine? Visit your health care professional for regular checks on your progress. You will need a regular breast and pelvic exam. If you have any reason to think you are pregnant, stop taking this medicine at once and contact your doctor or health care professional. What side effects may I notice from receiving this medicine? Side effects that you should report to your doctor or health care professional as soon as possible: -breast tenderness or discharge -changes in mood or emotions, such as depression -changes in vision or speech -pain in the abdomen, chest, groin, or leg -severe headache -skin rash, itching, or hives -sudden shortness of breath -unusually weak or tired -yellowing of skin or eyes Side effects that usually do not require medical attention (report to your doctor or health care professional if they continue or are bothersome): -acne -change in menstrual bleeding pattern or flow -changes in sexual desire -facial hair growth -fluid retention and swelling -headache -upset stomach -weight gain or loss This list may not describe all possible side effects. Call your doctor for medical advice about side effects. You may report side effects to FDA at 1-800-FDA-1088. Where should I keep my medicine? Keep out of the  reach of children. Store at room temperature between 20 and 25 degrees C (68 and 77 degrees F). Throw away any unused medicine after the expiration date. NOTE: This sheet is a summary. It may  not cover all possible information. If you have questions about this medicine, talk to your doctor, pharmacist, or health care provider.    2016, Elsevier/Gold Standard. (2008-04-11 11:26:12)

## 2016-01-21 NOTE — Progress Notes (Signed)
GYNECOLOGIC ONCOLOGY NEW PATIENT CONSULTATION  Date of Service: 01/21/16 Referring Provider: Arvella Nigh, MD Consulting Provider: Marcello Fennel. Carlis Abbott, MD  HISTORY OF PRESENT ILLNESS: Morgan Barker is a 50 y.o. woman who is seen in consultation at the request of Dr. Radene Knee for evaluation of endometrial hyperplasia with focal atypia in a polyp.  Morgan Barker is a nulligravida who presented with AUB. She underwent a ultrasound that showed a 2.2-cm endometrial thickness with either an intrauterine fibroid and/or polyp. She failed an office hysteroscopy because of significant cervical stenosis in the setting of a prior LEEP and was taken to the OR for resection. Final path showed a polyp with hyperplasia and focal atypia. Endometrial curettings showed no significant pathologic diagnosis but were predominantly blood.  Since her procedure, she has had no further bleeding.  PAST MEDICAL HISTORY: Past Medical History:  Diagnosis Date  . Abnormal uterine bleeding (AUB)   . Anemia   . Cervical stenosis (uterine cervix)   . History of colon polyps    hyperplastic 2014  . Hyperlipidemia   . Hypertension   . RBBB (right bundle branch block)     PAST SURGICAL HISTORY: Past Surgical History:  Procedure Laterality Date  . CERVICAL BIOPSY  W/ LOOP ELECTRODE EXCISION    . COLONOSCOPY  last one 11-01-2012  . DILATATION & CURETTAGE/HYSTEROSCOPY WITH MYOSURE N/A 01/08/2016   Procedure: DILATATION & CURETTAGE/HYSTEROSCOPY WITH MYOSURE;  Surgeon: Arvella Nigh, MD;  Location: [facility];  Service: Gynecology;  Laterality: N/A;  MYOSURE FOR POLYPS  . LAPAROSCOPIC CHOLECYSTECTOMY  10/08/2005    OB/GYN HISTORY: OB History  Gravida Para Term Preterm AB Living  0 0 0 0 0 0  SAB TAB Ectopic Multiple Live Births  0 0 0 0 0         Age at menopause: n/a Hx of HRT: denies Hx of STDs: denies Last pap: 2017 History of abnormal pap smears: Yes with prior LEEP   MEDICATIONS:  Current Outpatient  Prescriptions:  .  losartan-hydrochlorothiazide (HYZAAR) 100-12.5 MG tablet, TAKE 1 TABLET DAILY, Disp: 90 tablet, Rfl: 2 .  Multiple Vitamins-Iron (MULTIVITAMIN/IRON PO), Take by mouth daily.  , Disp: , Rfl:  .  medroxyPROGESTERone (PROVERA) 10 MG tablet, Take 1 tablet (10 mg total) by mouth daily., Disp: 30 tablet, Rfl: 3  ALLERGIES: No Known Allergies  FAMILY HISTORY: Family History  Problem Relation Age of Onset  . Hypertension Mother   . Colon cancer Father     diagnosed 77    SOCIAL HISTORY: Social History   Social History  . Marital status: Single    Spouse name: N/A  . Number of children: N/A  . Years of education: N/A   Occupational History  . Not on file.   Social History Main Topics  . Smoking status: Never Smoker  . Smokeless tobacco: Never Used  . Alcohol use No  . Drug use: No  . Sexual activity: Not on file   Other Topics Concern  . Not on file   Social History Narrative   Single. No children. Mother lives with patient.       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 HEENT: Normocephalic, atraumatic.  Sclera anicteric, posterior oropharynx clear.  Normal dentition. Chest: Clear to auscultation bilaterally. Cardiovascular: Regular rate and rhythm, no murmurs, rubs, or gallops. Abdomen: Obese. Normoactive bowel sounds.  Soft, nondistended, nontender to palpation.  No masses or hepatosplenomegaly appreciated.  No evidence of hernia.  No palpable fluid wave.   Extremities: Grossly normal range of motion.  Warm, well perfused.  No edema  bilaterally. Skin: No rashes or lesions. GU: External genitalia without lesions.  On speculum exam, normal appearing cervix.  Bimanual exam reveals mobile uterus, midposition, no palpable adnexal mass.   LABORATORY AND RADIOLOGIC DATA: Outside medical records were reviewed to synthesize the above history, along with the history and physical obtained during the visit.  Outside laboratory, pathology, and imaging reports were reviewed, with pertinent results below.    Pathology: Diagnosis 1. Endometrium, resection PROLIFERATIVE ENDOMETRIUM POLYP WITH HYPERPLASIA AND FOCAL ATYPIA NO EVIDENCE OF MALIGNANCY 2. Endometrium, curettage SCANT RED BLOOD CELLS  ASSESSMENT AND PLAN: Morgan Barker is a 49 y.o. woman with endometrial hyperplasia in a polyp with focal atypia.  Reviewed path and diagnosis. Discussed treatment options including hormonal therapy with IUD or provera versus hysterectomy. I don't feel a hysterectomy is needed at this time given focal atypia that appears isolated to a polyp. I would recommend hormonal management and offered Mirena IUD versus oral provera. She desires provera. Prescription for three months sent. She will need a repeat EMB at this time to confirm no further hyperplasia and can then stop her progesterone therapy and monitor bleeding symptoms. She can follow up with Dr. Radene Knee for her EMB or have it done in our office. She is planning to have her EMB with Dr. Radene Knee.  A copy of this note was sent to the patient's referring provider. I appreciate the opportunity to be involved in the care of this patient.  Marcello Fennel. Carlis Abbott, MD

## 2016-02-16 ENCOUNTER — Ambulatory Visit (INDEPENDENT_AMBULATORY_CARE_PROVIDER_SITE_OTHER): Payer: BLUE CROSS/BLUE SHIELD | Admitting: Family Medicine

## 2016-02-16 ENCOUNTER — Encounter: Payer: Self-pay | Admitting: Family Medicine

## 2016-02-16 VITALS — BP 118/72 | HR 92 | Temp 98.0°F | Ht 60.0 in | Wt 176.0 lb

## 2016-02-16 DIAGNOSIS — Z0000 Encounter for general adult medical examination without abnormal findings: Secondary | ICD-10-CM

## 2016-02-16 DIAGNOSIS — E785 Hyperlipidemia, unspecified: Secondary | ICD-10-CM

## 2016-02-16 DIAGNOSIS — I10 Essential (primary) hypertension: Secondary | ICD-10-CM

## 2016-02-16 DIAGNOSIS — D72829 Elevated white blood cell count, unspecified: Secondary | ICD-10-CM | POA: Insufficient documentation

## 2016-02-16 DIAGNOSIS — Z23 Encounter for immunization: Secondary | ICD-10-CM | POA: Diagnosis not present

## 2016-02-16 MED ORDER — LOSARTAN POTASSIUM-HCTZ 100-12.5 MG PO TABS: 1.0000 | ORAL_TABLET | Freq: Every day | ORAL | 2 refills | Status: DC

## 2016-02-16 NOTE — Patient Instructions (Addendum)
Sign release of information at the check out desk for last mammogram only  Send [place] a message after flu shot  Tdap today   Overall things look pretty good. Think you can turn cholesterol around without medicine. I would set a goal of at least 10 lbs down by follow up.  Recommendations are 150 minutes of exercise per week (try to find time to squeeze this in), 3-5 servings of veggies, 0 sugar sweetened beverages  6 month weight and blood pressure check, 1 year physical

## 2016-02-16 NOTE — Assessment & Plan Note (Signed)
Mild WBC elevation. Hematologist seen in past- over 10 years ago- was told blood coagulates quickly. Will monitor as long as regularly under 12k

## 2016-02-16 NOTE — Assessment & Plan Note (Signed)
HTN- controlled on losartan-hctz BP Readings from Last 3 Encounters:  01/21/16 119/63  01/08/16 127/86  04/16/15 116/72

## 2016-02-16 NOTE — Progress Notes (Signed)
Pre visit review using our clinic review tool, if applicable. No additional management support is needed unless otherwise documented below in the visit note. 

## 2016-02-16 NOTE — Progress Notes (Signed)
Phone: [phone]  Subjective:  Patient presents today for their annual physical. Chief complaint-noted.   See problem oriented charting- ROS- full  review of systems was completed and negative including No chest pain or shortness of breath. No headache or blurry vision. Did have some abdominal pain before ultrasound which has resolved  The following were reviewed and entered/updated in epic: Past Medical History:  Diagnosis Date  . Abnormal uterine bleeding (AUB)   . Anemia   . Cervical stenosis (uterine cervix)   . History of colon polyps    hyperplastic 2014  . Hyperlipidemia   . Hypertension   . RBBB (right bundle branch block)    Patient Active Problem List   Diagnosis Date Noted  . Leukocytosis 02/16/2016    Priority: High  . Hyperlipidemia 10/15/2014    Priority: Medium  . HTN (hypertension) 07/02/2011    Priority: Medium  . History of colonic polyps 06/20/2014    Priority: Low  . History of abnormal cervical Pap smear 06/20/2014    Priority: Low  . Obesity (BMI 30-39.9) 07/06/2013    Priority: Low  . NECK AND BACK PAIN 12/11/2007    Priority: Low  . Alopecia 03/17/2007    Priority: Low  . Complex endometrial hyperplasia 01/21/2016  . Endometrial polyp 01/08/2016    Class: Present on Admission   Past Surgical History:  Procedure Laterality Date  . CERVICAL BIOPSY  W/ LOOP ELECTRODE EXCISION    . COLONOSCOPY  last one 11-01-2012  . DILATATION & CURETTAGE/HYSTEROSCOPY WITH MYOSURE N/A 01/08/2016   Procedure: DILATATION & CURETTAGE/HYSTEROSCOPY WITH MYOSURE;  Surgeon: Arvella Nigh, MD;  Location: [facility];  Service: Gynecology;  Laterality: N/A;  MYOSURE FOR POLYPS  . LAPAROSCOPIC CHOLECYSTECTOMY  10/08/2005    Family History  Problem Relation Age of Onset  . Hypertension Mother   . Colon cancer Father     diagnosed 70    Medications- reviewed and updated Current Outpatient Prescriptions  Medication Sig Dispense Refill  .  losartan-hydrochlorothiazide (HYZAAR) 100-12.5 MG tablet Take 1 tablet by mouth daily. 90 tablet 2  . medroxyPROGESTERone (PROVERA) 10 MG tablet Take 1 tablet (10 mg total) by mouth daily. 30 tablet 3  . Multiple Vitamins-Iron (MULTIVITAMIN/IRON PO) Take by mouth daily.       No current facility-administered medications for this visit.     Allergies-reviewed and updated No Known Allergies  Social History   Social History  . Marital status: Single    Spouse name: N/A  . Number of children: N/A  . Years of education: N/A   Social History Main Topics  . Smoking status: Never Smoker  . Smokeless tobacco: Never Used  . Alcohol use No  . Drug use: No  . Sexual activity: Not on file   Other Topics Concern  . Not on file   Social History Narrative   Single. No children. Mother lives with patient.       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 Oropharynx normal Neck: no thyromegaly CV: RRR no murmurs rubs or gallops  Lungs: CTAB no crackles, wheeze, rhonchi Abdomen: soft/nontender/nondistended/normal bowel sounds. No rebound or guarding.  Ext: no edema Skin: warm, dry Neuro: grossly normal, moves all extremities, PERRLA  Assessment/Plan:  50 y.o. female presenting for annual physical.  Health Maintenance counseling: 1. Anticipatory guidance: Patient counseled regarding regular dental exams, eye exams, wearing seatbelts.  2. Risk factor reduction:  Advised patient of need for regular exercise and diet rich and fruits and vegetables to reduce risk of heart attack and stroke.  3. Immunizations/screenings/ancillary  studies Immunization History  Administered Date(s) Administered  . Influenza-Unspecified 01/24/2013, 01/24/2014  . Td- update today 02/24/2006   Health Maintenance Due  Topic Date Due  . Flu shot- at work on wednesday 11/25/2015   4. Cervical cancer screening- 09/02/14 with GYn but transformation zone not present- follow up with GYN. Endometrial biopsy planned due to polyps (ultrasond was done due to pain- no longer having pain) that were found- has some endometrial hyperplasia. Provera for 3 months- has been seen by GYN oncologist.  5. Breast cancer screening-  breast exam with GYN and mammogram may 2017- get records 6. Colon cancer screening - started age 49, repeat 26. PLan repeat at 52- no precancerous polyps  Status of chronic or acute concerns   Leukocytosis Mild WBC elevation. Hematologist seen in past- over 10 years ago- was told blood coagulates quickly. Will monitor as long as regularly under 12k  Hyperlipidemia  mld poor control. 10 year risk below Lab Results  Component Value Date   CHOL 185 10/08/2015   HDL 36.00 (L) 10/08/2015   LDLCALC 124 (H) 10/08/2015   LDLDIRECT 102.0 10/08/2014   TRIG 124.0 10/08/2015   CHOLHDL 5 10/08/2015  discussed importance of weight loss, healthy eating, exercise to lower long term risk but today risk is only 3.7% so would not start statin medication.   HTN (hypertension) HTN- controlled on losartan-hctz BP Readings from Last 3 Encounters:  01/21/16 119/63  01/08/16 127/86  04/16/15 116/72     Orders Placed This Encounter  Procedures  . Tdap vaccine greater than or equal to 7yo IM    Return in about 6 months (around 08/16/2016).  Return precautions advised.   Garret Reddish, MD

## 2016-02-16 NOTE — Assessment & Plan Note (Signed)
mld poor control. 10 year risk below Lab Results  Component Value Date   CHOL 185 10/08/2015   HDL 36.00 (L) 10/08/2015   LDLCALC 124 (H) 10/08/2015   LDLDIRECT 102.0 10/08/2014   TRIG 124.0 10/08/2015   CHOLHDL 5 10/08/2015  discussed importance of weight loss, healthy eating, exercise to lower long term risk but today risk is only 3.7% so would not start statin medication.

## 2016-02-18 ENCOUNTER — Encounter: Payer: Self-pay | Admitting: Family Medicine

## 2016-02-26 ENCOUNTER — Encounter: Payer: Self-pay | Admitting: Family Medicine

## 2016-06-23 ENCOUNTER — Other Ambulatory Visit: Payer: Self-pay

## 2016-06-23 MED ORDER — LOSARTAN POTASSIUM-HCTZ 100-12.5 MG PO TABS: 1.0000 | ORAL_TABLET | Freq: Every day | ORAL | 2 refills | Status: DC

## 2016-08-16 ENCOUNTER — Ambulatory Visit (INDEPENDENT_AMBULATORY_CARE_PROVIDER_SITE_OTHER): Payer: BLUE CROSS/BLUE SHIELD | Admitting: Family Medicine

## 2016-08-16 ENCOUNTER — Encounter: Payer: Self-pay | Admitting: Family Medicine

## 2016-08-16 VITALS — BP 118/80 | HR 94 | Temp 99.3°F | Ht 60.0 in | Wt 173.8 lb

## 2016-08-16 DIAGNOSIS — R21 Rash and other nonspecific skin eruption: Secondary | ICD-10-CM | POA: Diagnosis not present

## 2016-08-16 DIAGNOSIS — I10 Essential (primary) hypertension: Secondary | ICD-10-CM | POA: Diagnosis not present

## 2016-08-16 DIAGNOSIS — E7800 Pure hypercholesterolemia, unspecified: Secondary | ICD-10-CM

## 2016-08-16 MED ORDER — TRIAMCINOLONE ACETONIDE 0.1 % EX CREA: 1.0000 "application " | TOPICAL_CREAM | Freq: Two times a day (BID) | CUTANEOUS | 0 refills | Status: DC

## 2016-08-16 NOTE — Assessment & Plan Note (Signed)
S: controlled on losartan-hctz 100-12.5mg .  BP Readings from Last 3 Encounters:  08/16/16 118/80  02/16/16 118/72  01/21/16 119/63  A/P:Continue current meds:  Well controlled

## 2016-08-16 NOTE — Patient Instructions (Addendum)
No changes today  Great job on weight loss! Love the weight watchers idea   trial triamcinolone cream for 1 week. If no better or worsens- would trial lotrimin twice a day for 2 weeks. If that doesn't work- would refer to dermatology

## 2016-08-16 NOTE — Assessment & Plan Note (Signed)
S: mild poorly controlled on no rx. 10 year risk 3.7% on last check.  Lab Results  Component Value Date   CHOL 185 10/08/2015   HDL 36.00 (L) 10/08/2015   LDLCALC 124 (H) 10/08/2015   LDLDIRECT 102.0 10/08/2014   TRIG 124.0 10/08/2015   CHOLHDL 5 10/08/2015   A/P: weight down 3 lbs with weight watchers- encouraged her to continue her current efforts

## 2016-08-16 NOTE — Progress Notes (Signed)
Pre visit review using our clinic review tool, if applicable. No additional management support is needed unless otherwise documented below in the visit note. 

## 2016-08-16 NOTE — Progress Notes (Addendum)
Subjective:  Morgan Barker is a 51 y.o. year old very pleasant female patient who presents for/with See problem oriented charting ROS- No chest pain or shortness of breath. No headache or blurry vision. Some pain in shins when walking 10-15 minutes on treadmill . No new meds other than oral contraceptive. No oral involvement of rash. Not ill overall No known immunocompromised state.   Past Medical History-  Patient Active Problem List   Diagnosis Date Noted  . Leukocytosis 02/16/2016    Priority: High  . Hyperlipidemia 10/15/2014    Priority: Medium  . HTN (hypertension) 07/02/2011    Priority: Medium  . History of colonic polyps 06/20/2014    Priority: Low  . History of abnormal cervical Pap smear 06/20/2014    Priority: Low  . Obesity (BMI 30-39.9) 07/06/2013    Priority: Low  . NECK AND BACK PAIN 12/11/2007    Priority: Low  . Alopecia 03/17/2007    Priority: Low  . Complex endometrial hyperplasia 01/21/2016  . Endometrial polyp 01/08/2016    Class: Present on Admission    Medications- reviewed and updated Current Outpatient Prescriptions  Medication Sig Dispense Refill  . losartan-hydrochlorothiazide (HYZAAR) 100-12.5 MG tablet Take 1 tablet by mouth daily. 90 tablet 2  . Multiple Vitamins-Iron (MULTIVITAMIN/IRON PO) Take by mouth daily.      . Norethin Ace-Eth Estrad-FE (TAYTULLA) 1-20 MG-MCG(24) CAPS Take 1 capsule by mouth daily.    Marland Kitchen triamcinolone cream (KENALOG) 0.1 % Apply 1 application topically 2 (two) times daily. For 7-10 days maximum 80 g 0   No current facility-administered medications for this visit.     Objective: BP 118/80 (BP Location: Left Arm, Patient Position: Sitting, Cuff Size: Large)   Pulse 94   Temp 99.3 F (37.4 C) (Oral)   Ht 5' (1.524 m)   Wt 173 lb 12.8 oz (78.8 kg)   SpO2 94%   BMI 33.94 kg/m  Gen: NAD, resting comfortably CV: RRR no murmurs rubs or gallops Lungs: CTAB no crackles, wheeze, rhonchi Ext: no edema Skin: warm, dry, see  below  Assessment/Plan:  Rash S: for about a week has noted red slightly raised lesions x 2 on right thigh O: red raised patches about 3 x 2 cm and 2 x 2 cm close to each other on left upper thigh. Some scale on one but none on other. No raised border or central clearing A/P: trial triamcinolone cream for 1 week. If no better or worsens- would trial lotrimin twice a day for 2 weeks. If that doesn't work- would refer to dermatology. New birth control- possible drug reaction but doubt- no other new meds or contacts  For exercise- advised start 7-8 minutes daily and build up by a minute or two each week.   HTN (hypertension) S: controlled on losartan-hctz 100-12.5mg .  BP Readings from Last 3 Encounters:  08/16/16 118/80  02/16/16 118/72  01/21/16 119/63  A/P:Continue current meds:  Well controlled  Hyperlipidemia S: mild poorly controlled on no rx. 10 year risk 3.7% on last check.  Lab Results  Component Value Date   CHOL 185 10/08/2015   HDL 36.00 (L) 10/08/2015   LDLCALC 124 (H) 10/08/2015   LDLDIRECT 102.0 10/08/2014   TRIG 124.0 10/08/2015   CHOLHDL 5 10/08/2015   A/P: weight down 3 lbs with weight watchers- encouraged her to continue her current efforts  Return in about 6 months (around 02/15/2017) for physical.  Meds ordered this encounter  Medications  . Norethin Ace-Eth Estrad-FE (TAYTULLA)  1-20 MG-MCG(24) CAPS    Sig: Take 1 capsule by mouth daily.  Marland Kitchen triamcinolone cream (KENALOG) 0.1 %    Sig: Apply 1 application topically 2 (two) times daily. For 7-10 days maximum    Dispense:  80 g    Refill:  0    Return precautions advised.  Garret Reddish, MD

## 2016-08-24 NOTE — H&P (Signed)
NAMEHALLEE, Morgan Barker NO.:  [id number]  MEDICAL RECORD NO.:  [id number]  LOCATION:                                 FACILITY:  PHYSICIAN:  Darlyn Chamber, M.D.        DATE OF BIRTH:  DATE OF ADMISSION: DATE OF DISCHARGE:                             HISTORY & PHYSICAL   The date of her surgery is Sep 16, 2016, at [facility] on [address].  HISTORY OF PRESENT ILLNESS:  The patient is a 51 year old nulligravida female, who comes in for hysteroscopy with MyoSure resection.  In relation to present admission, the patient had presented initially with an abnormal uterine bleeding.  An ultrasound last year showed a 2.2 cm endometrial thickness.  There was either a fibroid or polyp.  She failed her office hysteroscopy because of significant cervical stenosis in the setting of a prior LEEP.  She was taken to the operating room where hysteroscopic resection showed a polyp with hyperplasia and focal atypia.  She is subsequently seen by Dr. Gillian Scarce, GYN oncologist. They reviewed the diagnosis and decided to proceed with hormonal treatment with medroxyprogesterone.  She came back in April of this year.  She had a saline infusion.  Ultrasound did again revealed a polypoid mass with endometrial cavity now.  She proceeds with hysteroscopy and resection of endometrial polyps in view of prior hyperplasia.  ALLERGIES:  The patient has no known drug allergies.  MEDICATION:  She is on losartan with hydrochlorothiazide for hypertension.  PAST MEDICAL HISTORY:  She has a history of hypertension.  PREVIOUS SURGICAL HISTORY:  She has had her gallbladder removed, had a previous loop electrical excision procedure of the cervix.  She has had previous hysteroscopy as noted.  SOCIAL HISTORY:  Reveals no tobacco or alcohol use.  FAMILY HISTORY:  Noncontributory.  REVIEW OF SYSTEMS:  Noncontributory.  PHYSICAL EXAMINATION:  VITAL SIGNS:  Afebrile.  Stable vital  signs. LUNGS:  Clear. CARDIOVASCULAR SYSTEM:  Regular rhythm and rate.  There are no murmurs or gallops. ABDOMEN:  Her abdominal exam is benign.  No mass, organomegaly, or tenderness. PELVIC:  Normal external genitalia.  Vaginal mucosa is clear.  Cervix unremarkable.  Uterus, normal size, shape, and contour.  Adnexa free of masses or tenderness.  IMPRESSION:  History of proliferative endometrium with hyperplasia and focal atypia with now recurrent polypoid masses for hysteroscopic evaluation.  PLAN:  The patient will undergo hysteroscopy with MyoSure resection along with dilation and curettage.  The risks have been discussed.  The risks including risk of infection; the risk of vascular injury that could lead to hemorrhage requiring transfusion with the risk of AIDS or hepatitis; risk of perforation with injury to adjacent organs, this could require further exploratory surgery; some complications could require hysterectomy; risk of deep venous thrombosis and pulmonary embolus.  The patient expressed understanding of indications and risks.     Darlyn Chamber, M.D.     JSM/MEDQ  D:  08/24/2016  T:  08/24/2016  Job:  [id number]

## 2016-08-24 NOTE — H&P (Signed)
Patient name [name], [name] DICTATION# [id number] CSN# [id number]  Darlyn Chamber, MD 08/24/2016 8:22 AM

## 2016-09-06 ENCOUNTER — Encounter (HOSPITAL_BASED_OUTPATIENT_CLINIC_OR_DEPARTMENT_OTHER): Payer: Self-pay | Admitting: *Deleted

## 2016-09-06 NOTE — Progress Notes (Signed)
NPO AFTER MN.  ARRIVE AT 0600.  NEEDS EKG.  GETTING LAB WORK DONE PRIOR TO DOS. (CBC, BMET, SERUM PREG).  WILL TAKE TAYTULLA AM DOS W/ SIPS OF WATER.

## 2016-09-10 DIAGNOSIS — I4510 Unspecified right bundle-branch block: Secondary | ICD-10-CM | POA: Diagnosis not present

## 2016-09-10 DIAGNOSIS — I10 Essential (primary) hypertension: Secondary | ICD-10-CM | POA: Diagnosis not present

## 2016-09-10 DIAGNOSIS — N840 Polyp of corpus uteri: Secondary | ICD-10-CM | POA: Diagnosis present

## 2016-09-10 DIAGNOSIS — N8502 Endometrial intraepithelial neoplasia [EIN]: Secondary | ICD-10-CM | POA: Diagnosis not present

## 2016-09-10 LAB — CBC
HEMATOCRIT: 44 % (ref 36.0–46.0)
HEMOGLOBIN: 14.5 g/dL (ref 12.0–15.0)
MCH: 27.8 pg (ref 26.0–34.0)
MCHC: 33 g/dL (ref 30.0–36.0)
MCV: 84.5 fL (ref 78.0–100.0)
Platelets: 335 10*3/uL (ref 150–400)
RBC: 5.21 MIL/uL — ABNORMAL HIGH (ref 3.87–5.11)
RDW: 13.6 % (ref 11.5–15.5)
WBC: 7.3 10*3/uL (ref 4.0–10.5)

## 2016-09-10 LAB — BASIC METABOLIC PANEL
ANION GAP: 12 (ref 5–15)
BUN: 17 mg/dL (ref 6–20)
CHLORIDE: 101 mmol/L (ref 101–111)
CO2: 25 mmol/L (ref 22–32)
Calcium: 9.9 mg/dL (ref 8.9–10.3)
Creatinine, Ser: 0.73 mg/dL (ref 0.44–1.00)
GFR calc Af Amer: >60 mL/min (ref >60)
GFR calc non Af Amer: >60 mL/min (ref >60)
GLUCOSE: 83 mg/dL (ref 65–99)
POTASSIUM: 3.7 mmol/L (ref 3.5–5.1)
Sodium: 138 mmol/L (ref 135–145)

## 2016-09-10 LAB — HCG, SERUM, QUALITATIVE: Preg, Serum: NEGATIVE

## 2016-09-16 ENCOUNTER — Encounter (HOSPITAL_BASED_OUTPATIENT_CLINIC_OR_DEPARTMENT_OTHER): Payer: Self-pay

## 2016-09-16 ENCOUNTER — Ambulatory Visit (HOSPITAL_BASED_OUTPATIENT_CLINIC_OR_DEPARTMENT_OTHER)
Admission: RE | Admit: 2016-09-16 | Discharge: 2016-09-16 | Disposition: A | Discharge status: Home / Self Care | Payer: BLUE CROSS/BLUE SHIELD | Source: Ambulatory Visit | Attending: Obstetrics and Gynecology | Admitting: Obstetrics and Gynecology

## 2016-09-16 ENCOUNTER — Other Ambulatory Visit: Payer: Self-pay

## 2016-09-16 ENCOUNTER — Ambulatory Visit (HOSPITAL_BASED_OUTPATIENT_CLINIC_OR_DEPARTMENT_OTHER): Payer: BLUE CROSS/BLUE SHIELD | Admitting: Anesthesiology

## 2016-09-16 ENCOUNTER — Encounter (HOSPITAL_BASED_OUTPATIENT_CLINIC_OR_DEPARTMENT_OTHER)
Admission: RE | Disposition: A | Discharge status: Home / Self Care | Payer: Self-pay | Source: Ambulatory Visit | Attending: Obstetrics and Gynecology

## 2016-09-16 DIAGNOSIS — N8502 Endometrial intraepithelial neoplasia [EIN]: Secondary | ICD-10-CM | POA: Diagnosis not present

## 2016-09-16 DIAGNOSIS — I10 Essential (primary) hypertension: Secondary | ICD-10-CM | POA: Insufficient documentation

## 2016-09-16 DIAGNOSIS — I4510 Unspecified right bundle-branch block: Secondary | ICD-10-CM | POA: Diagnosis not present

## 2016-09-16 HISTORY — PX: DILATATION & CURETTAGE/HYSTEROSCOPY WITH MYOSURE: SHX6511

## 2016-09-16 HISTORY — DX: Presence of spectacles and contact lenses: Z97.3

## 2016-09-16 HISTORY — DX: Polyp of corpus uteri: N84.0

## 2016-09-16 SURGERY — DILATATION & CURETTAGE/HYSTEROSCOPY WITH MYOSURE
Anesthesia: General | Site: Vagina

## 2016-09-16 MED ORDER — CEFAZOLIN SODIUM-DEXTROSE 2-4 GM/100ML-% IV SOLN
2.0000 g | INTRAVENOUS | Status: AC
  Administered 2016-09-16: 2 g via INTRAVENOUS
  Filled 2016-09-16: qty 100

## 2016-09-16 MED ORDER — FENTANYL CITRATE (PF) 100 MCG/2ML IJ SOLN
INTRAMUSCULAR | Status: AC
  Filled 2016-09-16: qty 2

## 2016-09-16 MED ORDER — PROPOFOL 10 MG/ML IV BOLUS
INTRAVENOUS | Status: DC | PRN
  Administered 2016-09-16 (×2): 50 mg via INTRAVENOUS
  Administered 2016-09-16: 150 mg via INTRAVENOUS

## 2016-09-16 MED ORDER — DEXAMETHASONE SODIUM PHOSPHATE 10 MG/ML IJ SOLN
INTRAMUSCULAR | Status: AC
  Filled 2016-09-16: qty 1

## 2016-09-16 MED ORDER — LIDOCAINE HCL 1 % IJ SOLN
INTRAMUSCULAR | Status: DC | PRN
  Administered 2016-09-16 (×2): 10 mL

## 2016-09-16 MED ORDER — ONDANSETRON HCL 4 MG/2ML IJ SOLN
INTRAMUSCULAR | Status: DC | PRN
  Administered 2016-09-16: 4 mg via INTRAVENOUS

## 2016-09-16 MED ORDER — PROPOFOL 10 MG/ML IV BOLUS
INTRAVENOUS | Status: AC
Start: 2016-09-16 — End: 2016-09-16
  Filled 2016-09-16: qty 40

## 2016-09-16 MED ORDER — MIDAZOLAM HCL 5 MG/5ML IJ SOLN
INTRAMUSCULAR | Status: DC | PRN
  Administered 2016-09-16: 1 mg via INTRAVENOUS

## 2016-09-16 MED ORDER — LACTATED RINGERS IV SOLN
INTRAVENOUS | Status: DC
  Administered 2016-09-16: 07:00:00 via INTRAVENOUS
  Filled 2016-09-16: qty 1000

## 2016-09-16 MED ORDER — DEXAMETHASONE SODIUM PHOSPHATE 4 MG/ML IJ SOLN
INTRAMUSCULAR | Status: DC | PRN
  Administered 2016-09-16: 10 mg via INTRAVENOUS

## 2016-09-16 MED ORDER — CEFAZOLIN SODIUM-DEXTROSE 2-4 GM/100ML-% IV SOLN
INTRAVENOUS | Status: AC
  Filled 2016-09-16: qty 100

## 2016-09-16 MED ORDER — HYDROMORPHONE HCL 1 MG/ML IJ SOLN
0.2500 mg | INTRAMUSCULAR | Status: DC | PRN
  Filled 2016-09-16: qty 0.5

## 2016-09-16 MED ORDER — MIDAZOLAM HCL 2 MG/2ML IJ SOLN
INTRAMUSCULAR | Status: AC
  Filled 2016-09-16: qty 2

## 2016-09-16 MED ORDER — PROMETHAZINE HCL 25 MG/ML IJ SOLN
6.2500 mg | INTRAMUSCULAR | Status: DC | PRN
  Filled 2016-09-16: qty 1

## 2016-09-16 MED ORDER — OXYCODONE-ACETAMINOPHEN 7.5-325 MG PO TABS: 1.0000 | ORAL_TABLET | ORAL | 0 refills | Status: DC | PRN

## 2016-09-16 MED ORDER — ONDANSETRON HCL 4 MG/2ML IJ SOLN
INTRAMUSCULAR | Status: AC
  Filled 2016-09-16: qty 2

## 2016-09-16 MED ORDER — OXYCODONE HCL 5 MG PO TABS
5.0000 mg | ORAL_TABLET | Freq: Once | ORAL | Status: AC | PRN
  Administered 2016-09-16: 5 mg via ORAL
  Filled 2016-09-16: qty 1

## 2016-09-16 MED ORDER — OXYCODONE HCL 5 MG/5ML PO SOLN
5.0000 mg | Freq: Once | ORAL | Status: AC | PRN
  Filled 2016-09-16: qty 5

## 2016-09-16 MED ORDER — OXYCODONE HCL 5 MG PO TABS
ORAL_TABLET | ORAL | Status: AC
  Filled 2016-09-16: qty 1

## 2016-09-16 MED ORDER — LIDOCAINE 2% (20 MG/ML) 5 ML SYRINGE
INTRAMUSCULAR | Status: DC | PRN
  Administered 2016-09-16: 60 mg via INTRAVENOUS

## 2016-09-16 SURGICAL SUPPLY — 23 items
CANISTER SUCT 3000ML PPV (MISCELLANEOUS) ×3 IMPLANT
CATH ROBINSON RED A/P 16FR (CATHETERS) ×2 IMPLANT
CLOTH BEACON ORANGE TIMEOUT ST (SAFETY) ×3 IMPLANT
DEVICE MYOSURE LITE (MISCELLANEOUS) ×2 IMPLANT
DEVICE MYOSURE REACH (MISCELLANEOUS) IMPLANT
DILATOR CANAL MILEX (MISCELLANEOUS) IMPLANT
FILTER ARTHROSCOPY CONVERTOR (FILTER) ×3 IMPLANT
GLOVE BIO SURGEON STRL SZ7 (GLOVE) ×6 IMPLANT
GOWN STRL REUS W/TWL XL LVL3 (GOWN DISPOSABLE) ×3 IMPLANT
IV NS IRRIG 3000ML ARTHROMATIC (IV SOLUTION) ×3 IMPLANT
KIT RM TURNOVER CYSTO AR (KITS) ×3 IMPLANT
MYOSURE XL FIBROID REM (MISCELLANEOUS)
NDL SPNL 18GX3.5 QUINCKE PK (NEEDLE) ×1 IMPLANT
NEEDLE SPNL 18GX3.5 QUINCKE PK (NEEDLE) ×3 IMPLANT
PACK VAGINAL MINOR WOMEN LF (CUSTOM PROCEDURE TRAY) ×3 IMPLANT
PAD OB MATERNITY 4.3X12.25 (PERSONAL CARE ITEMS) ×3 IMPLANT
PAD PREP 24X48 CUFFED NSTRL (MISCELLANEOUS) ×3 IMPLANT
SEAL ROD LENS SCOPE MYOSURE (ABLATOR) ×3 IMPLANT
SYSTEM TISS REMOVAL MYSR XL RM (MISCELLANEOUS) IMPLANT
TOWEL OR 17X24 6PK STRL BLUE (TOWEL DISPOSABLE) ×6 IMPLANT
TUBING AQUILEX INFLOW (TUBING) ×3 IMPLANT
TUBING AQUILEX OUTFLOW (TUBING) ×3 IMPLANT
WATER STERILE IRR 500ML POUR (IV SOLUTION) ×3 IMPLANT

## 2016-09-16 NOTE — Brief Op Note (Signed)
09/16/2016  7:47 AM  PATIENT:  Morgan Barker  51 y.o. female  PRE-OPERATIVE DIAGNOSIS:  polyps  POST-OPERATIVE DIAGNOSIS:  * No post-op diagnosis entered *  PROCEDURE:  Procedure(s): DILATATION & CURETTAGE/HYSTEROSCOPY WITH MYOSURE (N/A)  SURGEON:  Surgeon(s) and Role:    * Gonsalo Cuthbertson, MD - Primary  PHYSICIAN ASSISTANT:   ASSISTANTS: none   ANESTHESIA:   local and general  EBL:  Total I/O In: -  Out: 135 [Urine:110; Blood:25]  BLOOD ADMINISTERED:none  DRAINS: none   LOCAL MEDICATIONS USED:  XYLOCAINE   SPECIMEN:  Source of Specimen:  endometrial resections and curretting  DISPOSITION OF SPECIMEN:  PATHOLOGY  COUNTS:  YES  TOURNIQUET:  * No tourniquets in log *  DICTATION: .Other Dictation: Dictation Number [id number]  PLAN OF CARE: Discharge to home after PACU  PATIENT DISPOSITION:  PACU - hemodynamically stable.   Delay start of Pharmacological VTE agent (>24hrs) due to surgical blood loss or risk of bleeding:error not qpplicqble

## 2016-09-16 NOTE — Transfer of Care (Signed)
Immediate Anesthesia Transfer of Care Note  Patient: Morgan Barker  Procedure(s) Performed: Procedure(s): DILATATION & CURETTAGE/HYSTEROSCOPY WITH MYOSURE (N/A)  Patient Location: PACU  Anesthesia Type:General  Level of Consciousness: awake, alert , oriented and patient cooperative  Airway & Oxygen Therapy: Patient Spontanous Breathing and Patient connected to nasal cannula oxygen  Post-op Assessment: Report given to RN and Post -op Vital signs reviewed and stable  Post vital signs: Reviewed and stable  Last Vitals:  Vitals:   09/16/16 0609  BP: 115/67  Pulse: 85  Resp: 18  Temp: 37 C    Last Pain:  Vitals:   09/16/16 0609  TempSrc: Oral      Patients Stated Pain Goal: 5 (55/[id number]/41 [id number])  Complications: No apparent anesthesia complications

## 2016-09-16 NOTE — Anesthesia Preprocedure Evaluation (Addendum)
Anesthesia Evaluation  Patient identified by MRN, date of birth, ID band Patient awake    Reviewed: Allergy & Precautions, NPO status , Patient's Chart, lab work & pertinent test results  Airway Mallampati: II  TM Distance: >3 FB Neck ROM: Full    Dental no notable dental hx.    Pulmonary neg pulmonary ROS,    Pulmonary exam normal breath sounds clear to auscultation       Cardiovascular Exercise Tolerance: Good hypertension, Pt. on medications Normal cardiovascular exam Rhythm:Regular Rate:Normal  ECG: NSR, possible LAE, RBBB. Rate 74   Neuro/Psych negative neurological ROS  negative psych ROS   GI/Hepatic negative GI ROS, Neg liver ROS,   Endo/Other  negative endocrine ROS  Renal/GU negative Renal ROS  negative genitourinary   Musculoskeletal negative musculoskeletal ROS (+)   Abdominal   Peds negative pediatric ROS (+)  Hematology negative hematology ROS (+)   Anesthesia Other Findings Hyperlipidemia Obese, BMI 32  Reproductive/Obstetrics negative OB ROS Hcg: negative                            Anesthesia Physical Anesthesia Plan  ASA: II  Anesthesia Plan: General   Post-op Pain Management:    Induction: Intravenous  Airway Management Planned: LMA  Additional Equipment:   Intra-op Plan:   Post-operative Plan:   Informed Consent: I have reviewed the patients History and Physical, chart, labs and discussed the procedure including the risks, benefits and alternatives for the proposed anesthesia with the patient or authorized representative who has indicated his/her understanding and acceptance.   Dental advisory given  Plan Discussed with: CRNA and Surgeon  Anesthesia Plan Comments:         Anesthesia Quick Evaluation

## 2016-09-16 NOTE — Anesthesia Procedure Notes (Signed)
Procedure Name: LMA Insertion Date/Time: 09/16/2016 7:24 AM Performed by: Wanita Chamberlain Pre-anesthesia Checklist: Patient identified, Timeout performed, Emergency Drugs available, Suction available and Patient being monitored Patient Re-evaluated:Patient Re-evaluated prior to inductionOxygen Delivery Method: Circle system utilized Preoxygenation: Pre-oxygenation with 100% oxygen Intubation Type: IV induction Ventilation: Mask ventilation without difficulty LMA: LMA inserted LMA Size: 4.0 Laser Tube: Cuffed inflated with minimal occlusive pressure - saline Number of attempts: 1 Placement Confirmation: breath sounds checked- equal and bilateral and positive ETCO2 Tube secured with: Tape Dental Injury: Teeth and Oropharynx as per pre-operative assessment

## 2016-09-16 NOTE — Discharge Instructions (Signed)
° °  D & C Home care Instructions: ° ° °Personal hygiene:  Used sanitary napkins for vaginal drainage not tampons. Shower or tub bathe the day after your procedure. No douching until bleeding stops. Always wipe from front to back after  Elimination. ° °Activity: Do not drive or operate any equipment today. The effects of the anesthesia are still present and drowsiness may result. Rest today, not necessarily flat bed rest, just take it easy. You may resume your normal activity in one to 2 days. ° °Sexual activity: No intercourse for one week or as indicated by your physician ° °Diet: Eat a light diet as desired this evening. You may resume a regular diet tomorrow. ° °Return to work: One to 2 days. ° °General Expectations of your surgery: Vaginal bleeding should be no heavier than a normal period. Spotting may continue up to 10 days. Mild cramps may continue for a couple of days. You may have a regular period in 2-6 weeks. ° °Unexpected observations call your doctor if these occur: persistent or heavy bleeding. Severe abdominal cramping or pain. Elevation of temperature greater than 100°F. ° °Call for an appointment in one week. ° ° ° °Patient's Signature_______________________________________________________ ° °Nurse's Signature________________________________________________________ ° ° °Post Anesthesia Home Care Instructions ° °Activity: °Get plenty of rest for the remainder of the day. A responsible individual must stay with you for 24 hours following the procedure.  °For the next 24 hours, DO NOT: °-Drive a car °-Operate machinery °-Drink alcoholic beverages °-Take any medication unless instructed by your physician °-Make any legal decisions or sign important papers. ° °Meals: °Start with liquid foods such as gelatin or soup. Progress to regular foods as tolerated. Avoid greasy, spicy, heavy foods. If nausea and/or vomiting occur, drink only clear liquids until the nausea and/or vomiting subsides. Call your  physician if vomiting continues. ° °Special Instructions/Symptoms: °Your throat may feel dry or sore from the anesthesia or the breathing tube placed in your throat during surgery. If this causes discomfort, gargle with warm salt water. The discomfort should disappear within 24 hours. ° °If you had a scopolamine patch placed behind your ear for the management of post- operative nausea and/or vomiting: ° °1. The medication in the patch is effective for 72 hours, after which it should be removed.  Wrap patch in a tissue and discard in the trash. Wash hands thoroughly with soap and water. °2. You may remove the patch earlier than 72 hours if you experience unpleasant side effects which may include dry mouth, dizziness or visual disturbances. °3. Avoid touching the patch. Wash your hands with soap and water after contact with the patch. °  ° °

## 2016-09-16 NOTE — Op Note (Signed)
NAMEREETA, KUK NO.:  [id number]  MEDICAL RECORD NO.:  [id number]  LOCATION:                                 FACILITY:  PHYSICIAN:  Darlyn Chamber, M.D.        DATE OF BIRTH:  DATE OF PROCEDURE:  09/16/2016 DATE OF DISCHARGE:                              OPERATIVE REPORT   PREOPERATIVE DIAGNOSIS:  Previous history of endometrial hyperplasia with focal atypia.  POSTOPERATIVE DIAGNOSIS:  Previous history of endometrial hyperplasia with focal atypia with recurrent uterine polyps.  OPERATIVE PROCEDURE:  Paracervical block, cervical dilatation, hysteroscopy, resection of endometrial polyps, and endometrial curettings.  SURGEON:  Darlyn Chamber, MD.  ANESTHESIA:  Paracervical block with general.  ESTIMATED BLOOD LOSS:  Minimal.  PACKS AND DRAINS:  None.  INTRAOPERATIVE BLOOD PLACED:  None.  COMPLICATIONS:  None.  INDICATION:  Dictated in history and physical.  PROCEDURE IN DETAIL:  The patient was taken to the OR, placed in supine position.  After satisfactory level of general anesthesia is obtained, the patient was placed in dorsal supine position using the Allen stirrups.  The perineum and vagina were prepped out with Betadine and draped in sterile field.  Speculum was placed in vaginal vault.  The cervix was grasped with single-tooth tenaculum.  Paracervical blocks ensued using 1% xylocaine.  Uterus sounded to approximately 8 cm. Cervix was slowly dilated.  Hysteroscope was introduced.  Intrauterine cavity revealed multiple small endometrial polyps.  The MyoSure was brought in place.  These were completely resected.  Total deficit was 1000 mL with good resection.  There were no signs of perforation or other complications.  The hysteroscope was then removed.  The endometrial curettings were obtained and sent for pathological review.  Speculum and single-tooth tenaculum removed. Bleeding was minimal.  The patient was taken out of supine  position. Once alert and extubated, transferred to recovery room in good condition.  Sponge, instrument, and needle counts were correct by circulating nurse.     Darlyn Chamber, M.D.     JSM/MEDQ  D:  09/16/2016  T:  09/16/2016  Job:  [id number]

## 2016-09-16 NOTE — Anesthesia Postprocedure Evaluation (Signed)
Anesthesia Post Note  Patient: Darah Simkin  Procedure(s) Performed: Procedure(s) (LRB): DILATATION & CURETTAGE/HYSTEROSCOPY WITH MYOSURE (N/A)  Patient location during evaluation: PACU Anesthesia Type: General Level of consciousness: awake and alert Pain management: pain level controlled Vital Signs Assessment: post-procedure vital signs reviewed and stable Respiratory status: spontaneous breathing, nonlabored ventilation, respiratory function stable and patient connected to nasal cannula oxygen Cardiovascular status: blood pressure returned to baseline and stable Postop Assessment: no signs of nausea or vomiting Anesthetic complications: no       Last Vitals:  Vitals:   09/16/16 0815 09/16/16 0830  BP: 130/85   Pulse: 71 65  Resp: 18 20  Temp:      Last Pain:  Vitals:   09/16/16 0755  TempSrc:   PainSc: 0-No pain                 Ryan P Ellender

## 2016-09-16 NOTE — H&P (Signed)
  History and physical exam unchanged 

## 2016-09-21 ENCOUNTER — Encounter (HOSPITAL_BASED_OUTPATIENT_CLINIC_OR_DEPARTMENT_OTHER): Payer: Self-pay | Admitting: Obstetrics and Gynecology

## 2016-10-26 NOTE — H&P (Signed)
Patient name Morgan Barker, Morgan Barker DICTATION# [id number] CSN# [id number]  Darlyn Chamber, MD 10/26/2016 1:58 PM

## 2016-10-26 NOTE — H&P (Signed)
Morgan Barker, ZANDERS NO.:  [id number]  MEDICAL RECORD NO.:  [id number]  LOCATION:                                 FACILITY:  PHYSICIAN:  Darlyn Chamber, M.D.        DATE OF BIRTH:  DATE OF ADMISSION: DATE OF DISCHARGE:                             HISTORY & PHYSICAL   DATE OF SURGERY:  November 09, 2016, at [facility] outpatient area on [place].  HISTORY OF PRESENT ILLNESS:  The patient is a 51 year old nulligravida single female presents for laparoscopic-assisted vaginal hysterectomy with removal of both fallopian tubes and possibly both ovaries.  The patient has a history of atypical endometrial hyperplasia with atypia. This was initially noted in 2017.  She had a hysteroscopy with MyoSure resection of the endometrial polyp at that time.  Pathology did reveal proliferative endometrial polyp with hyperplasia with focal atypia.  She was seen by the gyn oncologist.  After discussion of options, we cycled it with Provera for 3 months and then repeated the endometrial sampling. The repeat endometrial sampling done in December of last year did show complex hyperplasia, but no atypia.  At that time, she was placed on birth control pills.  She subsequently was seen in the office and had evidence of recurrent polypoid mass.  She underwent hysteroscopic evaluation with resection of the endometrial polyp.  Pathology did reveal simple and complex hyperplasia with focal atypia.  We discussed this with the gyn oncologist, Dr. Loletta Specter.  She had suggested at this point in time to proceed with definitive therapies in form of hysterectomy with possible removal of both tubes and ovaries, for which she is admitted at the present time.  ALLERGIES:  She has no known drug allergies.  MEDICATIONS:  She is on losartan, hydrochlorothiazide, and birth control pills.  PAST MEDICAL HISTORY:  She has had a history of hypertension, for which she is under active  management.  PREVIOUS SURGICAL HISTORY:  She had her gallbladder removed.  She had a previous loop electrical excision procedure of the cervix.  She has had 2 previous hysteroscopies as noted.  SOCIAL HISTORY:  Reveals no tobacco or alcohol use.  FAMILY HISTORY:  Noncontributory.  REVIEW OF SYSTEMS:  Noncontributory.  PHYSICAL EXAMINATION:  VITAL SIGNS:  The patient is afebrile.  Stable vital signs. HEENT:  The patient is normocephalic.  Pupils equal, round, and reactive to light and accommodation.  Extraocular movements were intact.  Sclerae and conjunctivae were clear.  Oropharynx clear. LUNGS:  Clear. CARDIOVASCULAR SYSTEM:  Regular rhythm and rate.  There are no murmurs or gallops. ABDOMEN:  Benign.  No mass, organomegaly, or tenderness. PELVIC:  Normal external genitalia.  Vaginal cuff is clear.  Cervix unremarkable.  Uterus, normal size, shape, and contour.  Adnexa free of mass or tenderness. EXTREMITIES:  Trace edema. NEUROLOGIC:  Grossly within normal limits.  IMPRESSION:  Persistent endometrial hyperplasia with focal atypia.  PLAN:  The patient undergo a laparoscopic-assisted vaginal hysterectomy and possible removal of both tubes and ovaries.  Risks of surgery have been discussed including the risk of infection.  The risk of hemorrhage could require transfusion with the risk  of AIDS or hepatitis.  Risk of injury to adjacent organs including bladder, bowel, ureters that could require further exploratory surgery.  Risk of deep venous thrombosis and pulmonary embolus.  The patient expressed understanding of the potential risk and complications as well as indications.     Darlyn Chamber, M.D.     JSM/MEDQ  D:  10/26/2016  T:  10/26/2016  Job:  [id number]

## 2016-11-01 NOTE — Patient Instructions (Signed)
Makylah Bossard  11/01/2016             Shammara Jarrett  11/01/2016      Your procedure is scheduled on 11/09/2016    Report to [facility].M.  Call this number if you have problems the morning of surgery:[phone]             OUR ADDRESS IS [address] , WE ARE LOCATED IN [place].    Remember:  Do not eat food or drink liquids after midnight.  Take these medicines the morning of surgery with A SIP OF WATER: none   Do not wear jewelry, make-up or nail polish.  Do not wear lotions, powders, or perfumes, or deoderant.  Do not shave 48 hours prior to surgery.  Men may shave face and neck.  Do not bring valuables to the hospital.  [facility] is not responsible for any belongings or valuables.  Contacts, dentures or bridgework may not be worn into surgery.  Leave your suitcase in the car.  After surgery it may be brought to your room.  For patients admitted to the hospital, discharge time will be determined by your treatment team.   Special instructions:   Please read over the following fact sheets that you were given.    [facility] - Preparing for Surgery Before surgery, you can play an important role.  Because skin is not sterile, your skin needs to be as free of germs as possible.  You can reduce the number of germs on your skin by washing with CHG (chlorahexidine gluconate) soap before surgery.  CHG is an antiseptic cleaner which kills germs and bonds with the skin to continue killing germs even after washing. Please DO NOT use if you have an allergy to CHG or antibacterial soaps.  If your skin becomes reddened/irritated stop using the CHG and inform your nurse when you arrive at Short Stay. Do not shave (including legs and underarms) for at least 48 hours prior to the first CHG shower.  You may shave your face/neck. Please follow these instructions carefully:  1.  Shower with CHG Soap the night before  surgery and the  morning of Surgery.  2.  If you choose to wash your hair, wash your hair first as usual with your  normal  shampoo.  3.  After you shampoo, rinse your hair and body thoroughly to remove the  shampoo.                           4.  Use CHG as you would any other liquid soap.  You can apply chg directly  to the skin and wash                       Gently with a scrungie or clean washcloth.  5.  Apply the CHG Soap to your body ONLY FROM THE NECK DOWN.   Do not use on face/ open                           Wound or open sores. Avoid contact with eyes, ears mouth and genitals (private parts).  Wash face,  Genitals (private parts) with your normal soap.             6.  Wash thoroughly, paying special attention to the area where your surgery  will be performed.  7.  Thoroughly rinse your body with warm water from the neck down.  8.  DO NOT shower/wash with your normal soap after using and rinsing off  the CHG Soap.                9.  Pat yourself dry with a clean towel.            10.  Wear clean pajamas.            11.  Place clean sheets on your bed the night of your first shower and do not  sleep with pets. Day of Surgery : Do not apply any lotions/deodorants the morning of surgery.  Please wear clean clothes to the hospital/surgery center.  FAILURE TO FOLLOW THESE INSTRUCTIONS MAY RESULT IN THE CANCELLATION OF YOUR SURGERY PATIENT SIGNATURE_________________________________  NURSE SIGNATURE__________________________________  ________________________________________________________________________  WHAT IS A BLOOD TRANSFUSION? Blood Transfusion Information  A transfusion is the replacement of blood or some of its parts. Blood is made up of multiple cells which provide different functions.  Red blood cells carry oxygen and are used for blood loss replacement.  White blood cells fight against infection.  Platelets control bleeding.  Plasma helps clot  blood.  Other blood products are available for specialized needs, such as hemophilia or other clotting disorders. BEFORE THE TRANSFUSION  Who gives blood for transfusions?   Healthy volunteers who are fully evaluated to make sure their blood is safe. This is blood bank blood. Transfusion therapy is the safest it has ever been in the practice of medicine. Before blood is taken from a donor, a complete history is taken to make sure that person has no history of diseases nor engages in risky social behavior (examples are intravenous drug use or sexual activity with multiple partners). The donor's travel history is screened to minimize risk of transmitting infections, such as malaria. The donated blood is tested for signs of infectious diseases, such as HIV and hepatitis. The blood is then tested to be sure it is compatible with you in order to minimize the chance of a transfusion reaction. If you or a relative donates blood, this is often done in anticipation of surgery and is not appropriate for emergency situations. It takes many days to process the donated blood. RISKS AND COMPLICATIONS Although transfusion therapy is very safe and saves many lives, the main dangers of transfusion include:   Getting an infectious disease.  Developing a transfusion reaction. This is an allergic reaction to something in the blood you were given. Every precaution is taken to prevent this. The decision to have a blood transfusion has been considered carefully by your caregiver before blood is given. Blood is not given unless the benefits outweigh the risks. AFTER THE TRANSFUSION  Right after receiving a blood transfusion, you will usually feel much better and more energetic. This is especially true if your red blood cells have gotten low (anemic). The transfusion raises the level of the red blood cells which carry oxygen, and this usually causes an energy increase.  The nurse administering the transfusion will monitor  you carefully for complications. HOME CARE INSTRUCTIONS  No special instructions are needed after a transfusion. You may find your energy is better. Speak with your caregiver about any  limitations on activity for underlying diseases you may have. SEEK MEDICAL CARE IF:   Your condition is not improving after your transfusion.  You develop redness or irritation at the intravenous (IV) site. SEEK IMMEDIATE MEDICAL CARE IF:  Any of the following symptoms occur over the next 12 hours:  Shaking chills.  You have a temperature by mouth above 102 F (38.9 C), not controlled by medicine.  Chest, back, or muscle pain.  People around you feel you are not acting correctly or are confused.  Shortness of breath or difficulty breathing.  Dizziness and fainting.  You get a rash or develop hives.  You have a decrease in urine output.  Your urine turns a dark color or changes to pink, red, or brown. Any of the following symptoms occur over the next 10 days:  You have a temperature by mouth above 102 F (38.9 C), not controlled by medicine.  Shortness of breath.  Weakness after normal activity.  The white part of the eye turns yellow (jaundice).  You have a decrease in the amount of urine or are urinating less often.  Your urine turns a dark color or changes to pink, red, or brown. Document Released: 04/09/2000 Document Revised: 07/05/2011 Document Reviewed: 11/27/2007 [facility] Patient Information 2014 [employer], [place].  _______________________________________________________________________   _____________________________________________________________________

## 2016-11-02 ENCOUNTER — Encounter (HOSPITAL_COMMUNITY)
Admission: RE | Admit: 2016-11-02 | Discharge: 2016-11-02 | Disposition: A | Discharge status: Home / Self Care | Payer: BLUE CROSS/BLUE SHIELD | Source: Ambulatory Visit | Attending: Obstetrics and Gynecology | Admitting: Obstetrics and Gynecology

## 2016-11-02 ENCOUNTER — Encounter (HOSPITAL_COMMUNITY): Payer: Self-pay

## 2016-11-02 DIAGNOSIS — N8500 Endometrial hyperplasia, unspecified: Secondary | ICD-10-CM | POA: Insufficient documentation

## 2016-11-02 DIAGNOSIS — Z01812 Encounter for preprocedural laboratory examination: Secondary | ICD-10-CM | POA: Diagnosis not present

## 2016-11-02 LAB — BASIC METABOLIC PANEL
ANION GAP: 10 (ref 5–15)
BUN: 13 mg/dL (ref 6–20)
CHLORIDE: 100 mmol/L — AB (ref 101–111)
CO2: 25 mmol/L (ref 22–32)
Calcium: 9.8 mg/dL (ref 8.9–10.3)
Creatinine, Ser: 0.66 mg/dL (ref 0.44–1.00)
GFR calc Af Amer: >60 mL/min (ref >60)
Glucose, Bld: 92 mg/dL (ref 65–99)
POTASSIUM: 3.7 mmol/L (ref 3.5–5.1)
SODIUM: 135 mmol/L (ref 135–145)

## 2016-11-02 LAB — CBC
HEMATOCRIT: 39.7 % (ref 36.0–46.0)
HEMOGLOBIN: 13.6 g/dL (ref 12.0–15.0)
MCH: 28 pg (ref 26.0–34.0)
MCHC: 34.3 g/dL (ref 30.0–36.0)
MCV: 81.7 fL (ref 78.0–100.0)
Platelets: 305 10*3/uL (ref 150–400)
RBC: 4.86 MIL/uL (ref 3.87–5.11)
RDW: 13.4 % (ref 11.5–15.5)
WBC: 8.5 10*3/uL (ref 4.0–10.5)

## 2016-11-02 LAB — ABO/RH: ABO/RH(D): A NEG

## 2016-11-02 LAB — HCG, SERUM, QUALITATIVE: Preg, Serum: NEGATIVE

## 2016-11-02 NOTE — Progress Notes (Signed)
EKG-09/16/16-epic

## 2016-11-09 ENCOUNTER — Ambulatory Visit (HOSPITAL_BASED_OUTPATIENT_CLINIC_OR_DEPARTMENT_OTHER): Payer: BLUE CROSS/BLUE SHIELD | Admitting: Anesthesiology

## 2016-11-09 ENCOUNTER — Encounter (HOSPITAL_BASED_OUTPATIENT_CLINIC_OR_DEPARTMENT_OTHER): Payer: Self-pay | Admitting: *Deleted

## 2016-11-09 ENCOUNTER — Ambulatory Visit (HOSPITAL_BASED_OUTPATIENT_CLINIC_OR_DEPARTMENT_OTHER)
Admission: RE | Admit: 2016-11-09 | Discharge: 2016-11-10 | Disposition: A | Discharge status: Home / Self Care | Payer: BLUE CROSS/BLUE SHIELD | Source: Ambulatory Visit | Attending: Obstetrics and Gynecology | Admitting: Obstetrics and Gynecology

## 2016-11-09 ENCOUNTER — Encounter (HOSPITAL_COMMUNITY)
Admission: RE | Disposition: A | Discharge status: Home / Self Care | Payer: Self-pay | Source: Ambulatory Visit | Attending: Obstetrics and Gynecology

## 2016-11-09 DIAGNOSIS — N8501 Benign endometrial hyperplasia: Secondary | ICD-10-CM | POA: Insufficient documentation

## 2016-11-09 DIAGNOSIS — Z90710 Acquired absence of both cervix and uterus: Secondary | ICD-10-CM | POA: Diagnosis present

## 2016-11-09 DIAGNOSIS — N838 Other noninflammatory disorders of ovary, fallopian tube and broad ligament: Secondary | ICD-10-CM | POA: Diagnosis not present

## 2016-11-09 DIAGNOSIS — Z793 Long term (current) use of hormonal contraceptives: Secondary | ICD-10-CM | POA: Insufficient documentation

## 2016-11-09 DIAGNOSIS — I10 Essential (primary) hypertension: Secondary | ICD-10-CM | POA: Diagnosis not present

## 2016-11-09 DIAGNOSIS — E785 Hyperlipidemia, unspecified: Secondary | ICD-10-CM | POA: Insufficient documentation

## 2016-11-09 DIAGNOSIS — N83292 Other ovarian cyst, left side: Secondary | ICD-10-CM | POA: Diagnosis not present

## 2016-11-09 DIAGNOSIS — Z79899 Other long term (current) drug therapy: Secondary | ICD-10-CM | POA: Diagnosis not present

## 2016-11-09 DIAGNOSIS — I4510 Unspecified right bundle-branch block: Secondary | ICD-10-CM | POA: Insufficient documentation

## 2016-11-09 DIAGNOSIS — D259 Leiomyoma of uterus, unspecified: Secondary | ICD-10-CM | POA: Diagnosis not present

## 2016-11-09 HISTORY — PX: LAPAROSCOPIC VAGINAL HYSTERECTOMY WITH SALPINGECTOMY: SHX6680

## 2016-11-09 LAB — TYPE AND SCREEN
ABO/RH(D): A NEG
ANTIBODY SCREEN: NEGATIVE

## 2016-11-09 SURGERY — HYSTERECTOMY, VAGINAL, LAPAROSCOPY-ASSISTED, WITH SALPINGECTOMY
Anesthesia: General | Site: Uterus | Laterality: Bilateral

## 2016-11-09 MED ORDER — LACTATED RINGERS IR SOLN
Status: DC | PRN
  Administered 2016-11-09: 1

## 2016-11-09 MED ORDER — ROCURONIUM BROMIDE 50 MG/5ML IV SOSY
PREFILLED_SYRINGE | INTRAVENOUS | Status: AC
  Filled 2016-11-09: qty 5

## 2016-11-09 MED ORDER — LACTATED RINGERS IV SOLN
INTRAVENOUS | Status: DC
  Administered 2016-11-09 (×2): via INTRAVENOUS
  Filled 2016-11-09: qty 1000

## 2016-11-09 MED ORDER — KETOROLAC TROMETHAMINE 30 MG/ML IJ SOLN
INTRAMUSCULAR | Status: AC
  Filled 2016-11-09: qty 1

## 2016-11-09 MED ORDER — ONDANSETRON HCL 4 MG/2ML IJ SOLN
INTRAMUSCULAR | Status: DC | PRN
  Administered 2016-11-09: 4 mg via INTRAVENOUS

## 2016-11-09 MED ORDER — LIDOCAINE 2% (20 MG/ML) 5 ML SYRINGE
INTRAMUSCULAR | Status: DC | PRN
  Administered 2016-11-09: 100 mg via INTRAVENOUS

## 2016-11-09 MED ORDER — DIPHENHYDRAMINE HCL 50 MG/ML IJ SOLN: 12.5000 mg | Freq: Four times a day (QID) | INTRAMUSCULAR | Status: DC | PRN

## 2016-11-09 MED ORDER — MIDAZOLAM HCL 5 MG/5ML IJ SOLN
INTRAMUSCULAR | Status: DC | PRN
  Administered 2016-11-09: 2 mg via INTRAVENOUS

## 2016-11-09 MED ORDER — ONDANSETRON HCL 4 MG/2ML IJ SOLN
INTRAMUSCULAR | Status: AC
  Filled 2016-11-09: qty 2

## 2016-11-09 MED ORDER — SCOPOLAMINE 1 MG/3DAYS TD PT72
MEDICATED_PATCH | TRANSDERMAL | Status: AC
  Filled 2016-11-09: qty 1

## 2016-11-09 MED ORDER — LACTATED RINGERS IV SOLN
INTRAVENOUS | Status: DC
Start: 2016-11-09 — End: 2016-11-10
  Administered 2016-11-09: 1000 mL via INTRAVENOUS
  Filled 2016-11-09: qty 1000

## 2016-11-09 MED ORDER — FENTANYL CITRATE (PF) 100 MCG/2ML IJ SOLN
INTRAMUSCULAR | Status: AC
  Filled 2016-11-09: qty 2

## 2016-11-09 MED ORDER — SUGAMMADEX SODIUM 200 MG/2ML IV SOLN
INTRAVENOUS | Status: AC
  Filled 2016-11-09: qty 2

## 2016-11-09 MED ORDER — SUGAMMADEX SODIUM 200 MG/2ML IV SOLN
INTRAVENOUS | Status: DC | PRN
  Administered 2016-11-09: 200 mg via INTRAVENOUS

## 2016-11-09 MED ORDER — CEFAZOLIN SODIUM-DEXTROSE 2-4 GM/100ML-% IV SOLN
INTRAVENOUS | Status: AC
Start: 2016-11-09 — End: 2016-11-09
  Filled 2016-11-09: qty 100

## 2016-11-09 MED ORDER — LACTATED RINGERS IV SOLN
INTRAVENOUS | Status: DC
  Filled 2016-11-09: qty 1000

## 2016-11-09 MED ORDER — DEXAMETHASONE SODIUM PHOSPHATE 10 MG/ML IJ SOLN
INTRAMUSCULAR | Status: AC
Start: 2016-11-09 — End: 2016-11-09
  Filled 2016-11-09: qty 1

## 2016-11-09 MED ORDER — OXYCODONE-ACETAMINOPHEN 5-325 MG PO TABS: 1.0000 | ORAL_TABLET | ORAL | Status: DC | PRN

## 2016-11-09 MED ORDER — DEXAMETHASONE SODIUM PHOSPHATE 4 MG/ML IJ SOLN
INTRAMUSCULAR | Status: DC | PRN
  Administered 2016-11-09: 10 mg via INTRAVENOUS

## 2016-11-09 MED ORDER — NALOXONE HCL 0.4 MG/ML IJ SOLN: 0.4000 mg | INTRAMUSCULAR | Status: DC | PRN

## 2016-11-09 MED ORDER — CEFAZOLIN SODIUM-DEXTROSE 2-4 GM/100ML-% IV SOLN
2.0000 g | INTRAVENOUS | Status: AC
  Administered 2016-11-09: 2 g via INTRAVENOUS
  Filled 2016-11-09: qty 100

## 2016-11-09 MED ORDER — FENTANYL CITRATE (PF) 100 MCG/2ML IJ SOLN
25.0000 ug | INTRAMUSCULAR | Status: DC | PRN
  Administered 2016-11-09 (×2): 25 ug via INTRAVENOUS
  Administered 2016-11-09: 50 ug via INTRAVENOUS
  Administered 2016-11-09: 25 ug via INTRAVENOUS
  Filled 2016-11-09: qty 1

## 2016-11-09 MED ORDER — LIDOCAINE 2% (20 MG/ML) 5 ML SYRINGE
INTRAMUSCULAR | Status: AC
  Filled 2016-11-09: qty 5

## 2016-11-09 MED ORDER — ROCURONIUM BROMIDE 50 MG/5ML IV SOSY
PREFILLED_SYRINGE | INTRAVENOUS | Status: DC | PRN
Start: 2016-11-09 — End: 2016-11-09
  Administered 2016-11-09 (×2): 10 mg via INTRAVENOUS
  Administered 2016-11-09: 50 mg via INTRAVENOUS

## 2016-11-09 MED ORDER — KETOROLAC TROMETHAMINE 30 MG/ML IJ SOLN
INTRAMUSCULAR | Status: DC | PRN
  Administered 2016-11-09: 30 mg via INTRAVENOUS

## 2016-11-09 MED ORDER — ACETAMINOPHEN 325 MG PO TABS: 650.0000 mg | ORAL_TABLET | ORAL | Status: DC | PRN

## 2016-11-09 MED ORDER — SODIUM CHLORIDE 0.9% FLUSH: 9.0000 mL | INTRAVENOUS | Status: DC | PRN

## 2016-11-09 MED ORDER — SCOPOLAMINE 1 MG/3DAYS TD PT72
1.0000 | MEDICATED_PATCH | TRANSDERMAL | Status: DC
  Administered 2016-11-09: 1.5 mg via TRANSDERMAL
  Filled 2016-11-09: qty 1

## 2016-11-09 MED ORDER — PROPOFOL 10 MG/ML IV BOLUS
INTRAVENOUS | Status: AC
  Filled 2016-11-09: qty 20

## 2016-11-09 MED ORDER — ONDANSETRON HCL 4 MG PO TABS: 4.0000 mg | ORAL_TABLET | Freq: Four times a day (QID) | ORAL | Status: DC | PRN

## 2016-11-09 MED ORDER — MENTHOL 3 MG MT LOZG
1.0000 | LOZENGE | OROMUCOSAL | Status: DC | PRN
  Filled 2016-11-09: qty 9

## 2016-11-09 MED ORDER — METOCLOPRAMIDE HCL 5 MG/ML IJ SOLN
10.0000 mg | Freq: Once | INTRAMUSCULAR | Status: DC | PRN
  Filled 2016-11-09: qty 2

## 2016-11-09 MED ORDER — MIDAZOLAM HCL 2 MG/2ML IJ SOLN
INTRAMUSCULAR | Status: AC
  Filled 2016-11-09: qty 2

## 2016-11-09 MED ORDER — ONDANSETRON HCL 4 MG/2ML IJ SOLN: 4.0000 mg | Freq: Four times a day (QID) | INTRAMUSCULAR | Status: DC | PRN

## 2016-11-09 MED ORDER — BUPIVACAINE HCL (PF) 0.25 % IJ SOLN
INTRAMUSCULAR | Status: DC | PRN
  Administered 2016-11-09: 7 mL

## 2016-11-09 MED ORDER — HYDROMORPHONE 1 MG/ML IV SOLN
INTRAVENOUS | Status: DC
  Administered 2016-11-09: 12:00:00 via INTRAVENOUS
  Administered 2016-11-09: 0.6 mg via INTRAVENOUS
  Administered 2016-11-09: 4 mg via INTRAVENOUS
  Administered 2016-11-10: 0.2 mg via INTRAVENOUS
  Administered 2016-11-10: 0 mg via INTRAVENOUS
  Filled 2016-11-09: qty 25

## 2016-11-09 MED ORDER — STERILE WATER FOR IRRIGATION IR SOLN
Status: DC | PRN
  Administered 2016-11-09: 1

## 2016-11-09 MED ORDER — MEPERIDINE HCL 25 MG/ML IJ SOLN
6.2500 mg | INTRAMUSCULAR | Status: DC | PRN
  Filled 2016-11-09: qty 1

## 2016-11-09 MED ORDER — DIPHENHYDRAMINE HCL 12.5 MG/5ML PO ELIX: 12.5000 mg | ORAL_SOLUTION | Freq: Four times a day (QID) | ORAL | Status: DC | PRN

## 2016-11-09 MED ORDER — FENTANYL CITRATE (PF) 100 MCG/2ML IJ SOLN
INTRAMUSCULAR | Status: DC | PRN
  Administered 2016-11-09 (×2): 50 ug via INTRAVENOUS
  Administered 2016-11-09: 25 ug via INTRAVENOUS
  Administered 2016-11-09: 50 ug via INTRAVENOUS
  Administered 2016-11-09 (×2): 25 ug via INTRAVENOUS
  Administered 2016-11-09: 50 ug via INTRAVENOUS
  Administered 2016-11-09: 25 ug via INTRAVENOUS

## 2016-11-09 MED ORDER — PROPOFOL 10 MG/ML IV BOLUS
INTRAVENOUS | Status: DC | PRN
  Administered 2016-11-09: 200 mg via INTRAVENOUS

## 2016-11-09 SURGICAL SUPPLY — 69 items
ADH SKN CLS APL DERMABOND .7 (GAUZE/BANDAGES/DRESSINGS) ×2
BAG SPEC RTRVL LRG 6X4 10 (ENDOMECHANICALS)
BLADE CLIPPER SURG (BLADE) IMPLANT
CANISTER SUCT 3000ML PPV (MISCELLANEOUS) ×6 IMPLANT
CATH ROBINSON RED A/P 16FR (CATHETERS) ×3 IMPLANT
CLOSURE WOUND 1/4X4 (GAUZE/BANDAGES/DRESSINGS)
CLOTH BEACON ORANGE TIMEOUT ST (SAFETY) ×3 IMPLANT
COVER BACK TABLE 60X90IN (DRAPES) ×6 IMPLANT
COVER MAYO STAND STRL (DRAPES) ×6 IMPLANT
DERMABOND ADVANCED (GAUZE/BANDAGES/DRESSINGS) ×4
DERMABOND ADVANCED .7 DNX12 (GAUZE/BANDAGES/DRESSINGS) ×1 IMPLANT
DRSG OPSITE POSTOP 3X4 (GAUZE/BANDAGES/DRESSINGS) ×3 IMPLANT
ELECT REM PT RETURN 9FT ADLT (ELECTROSURGICAL) ×3
ELECTRODE REM PT RTRN 9FT ADLT (ELECTROSURGICAL) ×1 IMPLANT
FILTER SMOKE EVAC LAPAROSHD (FILTER) IMPLANT
GLOVE BIO SURGEON STRL SZ7 (GLOVE) ×12 IMPLANT
GLOVE BIOGEL PI IND STRL 6.5 (GLOVE) ×1 IMPLANT
GLOVE BIOGEL PI INDICATOR 6.5 (GLOVE) ×2
HOLDER FOLEY CATH W/STRAP (MISCELLANEOUS) ×3 IMPLANT
IV LACTATED RINGER IRRG 3000ML (IV SOLUTION) ×3
IV LR IRRIG 3000ML ARTHROMATIC (IV SOLUTION) IMPLANT
KIT RM TURNOVER CYSTO AR (KITS) ×3 IMPLANT
LEGGING LITHOTOMY PAIR STRL (DRAPES) ×3 IMPLANT
NDL INSUFFLATION 14GA 120MM (NEEDLE) IMPLANT
NDL INSUFFLATION 14GA 150MM (NEEDLE) IMPLANT
NEEDLE INSUFFLATION 14GA 120MM (NEEDLE) IMPLANT
NEEDLE INSUFFLATION 14GA 150MM (NEEDLE) IMPLANT
NS IRRIG 500ML POUR BTL (IV SOLUTION) ×3 IMPLANT
PACK LAVH (CUSTOM PROCEDURE TRAY) ×3 IMPLANT
PACK ROBOTIC GOWN (GOWN DISPOSABLE) ×3 IMPLANT
PACK TRENDGUARD 450 HYBRID PRO (MISCELLANEOUS) IMPLANT
PACK TRENDGUARD 600 HYBRD PROC (MISCELLANEOUS) IMPLANT
PAD OB MATERNITY 4.3X12.25 (PERSONAL CARE ITEMS) ×3 IMPLANT
PAD POSITIONING PINK XL (MISCELLANEOUS) ×2 IMPLANT
PAD PREP 24X48 CUFFED NSTRL (MISCELLANEOUS) ×3 IMPLANT
POUCH SPECIMEN RETRIEVAL 10MM (ENDOMECHANICALS) IMPLANT
SCISSORS LAP 5X35 DISP (ENDOMECHANICALS) IMPLANT
SCISSORS LAP 5X45 EPIX DISP (ENDOMECHANICALS) IMPLANT
SEALER TISSUE G2 CVD JAW 45CM (ENDOMECHANICALS) ×3 IMPLANT
SET IRRIG TUBING LAPAROSCOPIC (IRRIGATION / IRRIGATOR) ×3 IMPLANT
SET IRRIG Y TYPE TUR BLADDER L (SET/KITS/TRAYS/PACK) IMPLANT
SOLUTION ELECTROLUBE (MISCELLANEOUS) IMPLANT
STRIP CLOSURE SKIN 1/4X4 (GAUZE/BANDAGES/DRESSINGS) IMPLANT
SUT MNCRL AB 4-0 PS2 18 (SUTURE) ×3 IMPLANT
SUT MON AB 2-0 CT1 36 (SUTURE) ×3 IMPLANT
SUT VIC AB 0 CT1 18XCR BRD8 (SUTURE) ×2 IMPLANT
SUT VIC AB 0 CT1 36 (SUTURE) ×6 IMPLANT
SUT VIC AB 0 CT1 8-18 (SUTURE) ×6
SUT VIC AB 2-0 CT1 (SUTURE) IMPLANT
SUT VIC AB 2-0 SH 27 (SUTURE)
SUT VIC AB 2-0 SH 27XBRD (SUTURE) IMPLANT
SUT VIC AB 3-0 SH 27 (SUTURE)
SUT VIC AB 3-0 SH 27X BRD (SUTURE) IMPLANT
SUT VICRYL 0 UR6 27IN ABS (SUTURE) IMPLANT
SUT VICRYL 1 TIES 12X18 (SUTURE) ×3 IMPLANT
SUT VICRYL 4-0 PS2 18IN ABS (SUTURE) ×3 IMPLANT
SYR BULB IRRIGATION 50ML (SYRINGE) IMPLANT
TOWEL OR 17X24 6PK STRL BLUE (TOWEL DISPOSABLE) ×6 IMPLANT
TRAY FOLEY CATH SILVER 14FR (SET/KITS/TRAYS/PACK) ×3 IMPLANT
TRENDGUARD 450 HYBRID PRO PACK (MISCELLANEOUS)
TRENDGUARD 600 HYBRID PROC PK (MISCELLANEOUS)
TROCAR BALLN 12MMX100 BLUNT (TROCAR) ×2 IMPLANT
TROCAR OPTI TIP 5M 100M (ENDOMECHANICALS) ×3 IMPLANT
TROCAR XCEL 12X100 BLDLESS (ENDOMECHANICALS) IMPLANT
TROCAR XCEL DIL TIP R 11M (ENDOMECHANICALS) ×2 IMPLANT
TUBING INSUF HEATED (TUBING) ×3 IMPLANT
WARMER LAPAROSCOPE (MISCELLANEOUS) ×3 IMPLANT
WATER STERILE IRR 3000ML UROMA (IV SOLUTION) ×2 IMPLANT
WATER STERILE IRR 500ML POUR (IV SOLUTION) ×1 IMPLANT

## 2016-11-09 NOTE — H&P (Signed)
  History and physical exam unchanged 

## 2016-11-09 NOTE — Anesthesia Postprocedure Evaluation (Signed)
Anesthesia Post Note  Patient: Morgan Barker  Procedure(s) Performed: Procedure(s) (LRB): LAPAROSCOPIC ASSISTED VAGINAL HYSTERECTOMY WITH SALPINGECTOMY,  oophorectomy, cystoscopy (Bilateral)     Patient location during evaluation: PACU Anesthesia Type: General Level of consciousness: awake and alert Pain management: pain level controlled Vital Signs Assessment: post-procedure vital signs reviewed and stable Respiratory status: spontaneous breathing, nonlabored ventilation, respiratory function stable and patient connected to nasal cannula oxygen Cardiovascular status: blood pressure returned to baseline and stable Postop Assessment: no signs of nausea or vomiting Anesthetic complications: no    Last Vitals:  Vitals:   11/09/16 1215 11/09/16 1221  BP:  115/75  Pulse:  87  Resp: 16 20  Temp:  36.8 C    Last Pain:  Vitals:   11/09/16 1221  TempSrc: Oral  PainSc:                  Montez Hageman

## 2016-11-09 NOTE — Progress Notes (Signed)
Patient ID: Morgan Barker, female   DOB: 10-May-1965, 51 y.o.   MRN: [id number] AF VSS ABD SOFT INC CLEAR GOOD UO

## 2016-11-09 NOTE — Progress Notes (Addendum)
Patient is alert and oriented x 3. Pain level of 4/10 at this time. Dilaudid reduced dose PCA started earlier, witnessed by another Therapist, sports. IVF infusing well. No c/o nausea, was given ginger ale. Oriented to room and environment. Vital signs monitoring cont.  Incision sites intact.Will continue to monitor.

## 2016-11-09 NOTE — Anesthesia Procedure Notes (Signed)
Procedure Name: Intubation Date/Time: 11/09/2016 7:37 AM Performed by: Denna Haggard D Pre-anesthesia Checklist: Patient identified, Emergency Drugs available, Suction available and Patient being monitored Patient Re-evaluated:Patient Re-evaluated prior to induction Oxygen Delivery Method: Circle system utilized Preoxygenation: Pre-oxygenation with 100% oxygen Induction Type: IV induction Ventilation: Mask ventilation without difficulty Laryngoscope Size: Mac and 3 Grade View: Grade I Tube type: Oral Tube size: 7.0 mm Number of attempts: 1 Airway Equipment and Method: Stylet and Oral airway Placement Confirmation: ETT inserted through vocal cords under direct vision,  positive ETCO2 and breath sounds checked- equal and bilateral Secured at: 19 cm Tube secured with: Tape Dental Injury: Teeth and Oropharynx as per pre-operative assessment

## 2016-11-09 NOTE — Progress Notes (Signed)
Patient tolerated clear liquids. No c/o n/v. Diet advanced to full liquids.

## 2016-11-09 NOTE — Brief Op Note (Signed)
11/09/2016  9:25 AM  PATIENT:  Morgan Barker  51 y.o. female  PRE-OPERATIVE DIAGNOSIS:  simple and complex hyperplasia with focal atypia  POST-OPERATIVE DIAGNOSIS:  simple and complex hyperplasia with focal atypia  PROCEDURE:  Procedure(s): LAPAROSCOPIC ASSISTED VAGINAL HYSTERECTOMY WITH SALPINGECTOMY,  oophorectomy, cystoscopy (Bilateral)  SURGEON:  Surgeon(s) and Role:    * Arvella Nigh, MD - Primary  PHYSICIAN ASSISTANT:   ASSISTANTS: leger    ANESTHESIA:   regional and general  EBL:  Total I/O In: 1000 [I.V.:1000] Out: 650 [Urine:300; Blood:350]  BLOOD ADMINISTERED:none  DRAINS: Urinary Catheter (Foley)   LOCAL MEDICATIONS USED:  XYLOCAINE   SPECIMEN:  Source of Specimen:  uterus tubes and ovaries  DISPOSITION OF SPECIMEN:  PATHOLOGY  COUNTS:  YES  TOURNIQUET:  * No tourniquets in log *  DICTATION: .Other Dictation: Dictation Number [id number]  PLAN OF CARE: Admit for overnight observation  PATIENT DISPOSITION:  PACU - hemodynamically stable.   Delay start of Pharmacological VTE agent (>24hrs) due to surgical blood loss or risk of bleeding: yes

## 2016-11-09 NOTE — Transfer of Care (Signed)
Immediate Anesthesia Transfer of Care Note  Patient: Morgan Barker  Procedure(s) Performed: Procedure(s) (LRB): LAPAROSCOPIC ASSISTED VAGINAL HYSTERECTOMY WITH SALPINGECTOMY,  oophorectomy, cystoscopy (Bilateral)  Patient Location: PACU  Anesthesia Type: General  Level of Consciousness: awake, oriented, sedated and patient cooperative  Airway & Oxygen Therapy: Patient Spontanous Breathing and Patient connected to face mask oxygen  Post-op Assessment: Report given to PACU RN and Post -op Vital signs reviewed and stable  Post vital signs: Reviewed and stable  Complications: No apparent anesthesia complications  Last Vitals:  Vitals:   11/09/16 0627 11/09/16 0936  BP: 125/84 126/86  Pulse: 87 96  Resp: 17 10  Temp: 37.3 C 36.6 C    Last Pain:  Vitals:   11/09/16 0627  TempSrc: Oral      Patients Stated Pain Goal: 6 (11/09/16 0627)

## 2016-11-09 NOTE — Anesthesia Preprocedure Evaluation (Signed)
Anesthesia Evaluation  Patient identified by MRN, date of birth, ID band Patient awake    Reviewed: Allergy & Precautions, NPO status , Patient's Chart, lab work & pertinent test results  Airway Mallampati: II  TM Distance: >3 FB Neck ROM: Full    Dental no notable dental hx.    Pulmonary neg pulmonary ROS,    Pulmonary exam normal breath sounds clear to auscultation       Cardiovascular Exercise Tolerance: Good hypertension, Pt. on medications Normal cardiovascular exam Rhythm:Regular Rate:Normal  ECG: NSR, possible LAE, RBBB. Rate 74   Neuro/Psych negative neurological ROS  negative psych ROS   GI/Hepatic negative GI ROS, Neg liver ROS,   Endo/Other  negative endocrine ROS  Renal/GU negative Renal ROS  negative genitourinary   Musculoskeletal negative musculoskeletal ROS (+)   Abdominal   Peds negative pediatric ROS (+)  Hematology negative hematology ROS (+)   Anesthesia Other Findings Hyperlipidemia Obese, BMI 32  Reproductive/Obstetrics negative OB ROS Hcg: negative                             Anesthesia Physical  Anesthesia Plan  ASA: II  Anesthesia Plan: General   Post-op Pain Management:    Induction: Intravenous  PONV Risk Score and Plan: 4 or greater and Ondansetron, Dexamethasone, Propofol, Midazolam and Scopolamine patch - Pre-op  Airway Management Planned: Oral ETT  Additional Equipment:   Intra-op Plan:   Post-operative Plan:   Informed Consent: I have reviewed the patients History and Physical, chart, labs and discussed the procedure including the risks, benefits and alternatives for the proposed anesthesia with the patient or authorized representative who has indicated his/her understanding and acceptance.   Dental advisory given  Plan Discussed with: CRNA and Surgeon  Anesthesia Plan Comments:        Anesthesia Quick Evaluation

## 2016-11-10 ENCOUNTER — Encounter (HOSPITAL_BASED_OUTPATIENT_CLINIC_OR_DEPARTMENT_OTHER): Payer: Self-pay | Admitting: Obstetrics and Gynecology

## 2016-11-10 DIAGNOSIS — N8501 Benign endometrial hyperplasia: Secondary | ICD-10-CM | POA: Diagnosis not present

## 2016-11-10 LAB — CBC
HEMATOCRIT: 35.8 % — AB (ref 36.0–46.0)
Hemoglobin: 11.7 g/dL — ABNORMAL LOW (ref 12.0–15.0)
MCH: 26.7 pg (ref 26.0–34.0)
MCHC: 32.7 g/dL (ref 30.0–36.0)
MCV: 81.7 fL (ref 78.0–100.0)
PLATELETS: 257 10*3/uL (ref 150–400)
RBC: 4.38 MIL/uL (ref 3.87–5.11)
RDW: 13.4 % (ref 11.5–15.5)
WBC: 11.4 10*3/uL — AB (ref 4.0–10.5)

## 2016-11-10 MED ORDER — OXYCODONE-ACETAMINOPHEN 7.5-325 MG PO TABS: 1.0000 | ORAL_TABLET | ORAL | 0 refills | Status: DC | PRN

## 2016-11-10 NOTE — Op Note (Signed)
NAMECALIOPE, RUPPERT NO.:  [id number]  MEDICAL RECORD NO.:  [id number]  LOCATION:                                 FACILITY:  PHYSICIAN:  Darlyn Chamber, M.D.        DATE OF BIRTH:  DATE OF PROCEDURE:  11/09/2016 DATE OF DISCHARGE:                              OPERATIVE REPORT   PREOPERATIVE DIAGNOSIS:  Endometrial hyperplasia with focal atypia.  POSTOPERATIVE DIAGNOSIS:  Endometrial hyperplasia with focal atypia.  OPERATIVE PROCEDURE:  Laparoscopic-assisted vaginal hysterectomy with bilateral salpingo-oophorectomy and cystoscopy.  SURGEON:  Darlyn Chamber, M.D.  ASSISTANT:  Theodoro Clock.  ANESTHESIA:  General endotracheal.  BLOOD LOSS:  400 cc.  PACKS:  None.  DRAINS:  Included urethral Foley.  INTRAOPERATIVE BLOOD REPLACEMENT:  None.  COMPLICATIONS:  None.  INDICATIONS:  Dictated in history and physical.  PROCEDURE IN DETAIL:  The patient was taken to the OR and placed in the supine position.  After satisfactory level of general endotracheal anesthesia was obtained, the patient was placed in dorsal lithotomy position using the Allen stirrups.  The abdomen, perineum, and vagina were prepped out with Betadine.  Bladder was emptied by in-and-out catheterization.  A Hulka tenaculum was put in place and secured.  The patient was then draped in sterile field.  A subumbilical incision was made with a knife and carried through the subcutaneous tissue.  Fascia was entered sharply and incision in the fascia was extended laterally. Peritoneum was entered with blunt finger pressure.  An open laparoscopic trocar was put in place and secured.  The abdomen was insufflated with carbon dioxide.  The laparoscope was introduced.  There was no evidence of injury to adjacent organs.  The upper abdomen including liver was normal.  The gallbladder was surgically absent.  Appendix was normal. Both lateral gutters were clear.  Uterus and ovaries were unremarkable. No  pelvic adhesions or other abnormalities.  A 5 mm trocar was put in place in the suprapubic area under direct visualization.  We first elevated the right tube and ovary.  The ureter was easily seen along the right pelvic sidewall.  Using the EnSeal, the right ovarian vasculature was cauterized and incised.  Mesenteric attachments were then cauterized and incised.  The right round ligament was cauterized and incised.  We then went to the left side.  Left tube and ovary were elevated.  Again, the ureter could be seen along the pelvic sidewall.  The left ovarian vasculature was cauterized and incised.  The mesenteric attachments of the tube and ovary were cauterized and incised up to the side of the uterus.  The left round ligament was cauterized and incised.  We had good hemostasis.  The decision was to go vaginally.  Laparoscope was removed along with the grasper.  Abdomen was deflated of carbon dioxide. Legs were repositioned.  The Hulka tenaculum was then removed.  A weighted speculum was placed in the vaginal vault.  The cervix was grasped with Ardis Hughs tenaculum.  The reflection of the vaginal mucosa was incised using the Bovie.  Cul-de-sac was entered sharply.  Both uterosacral ligaments were clamped, cut, and suture ligated with 0- Vicryl.  The bladder was dissected further superiorly.  Using the clamp, cut and tie technique with suture ligature of 0 Vicryl, the parametrium was serially separated from side of the uterus.  The vesicouterine space was entered.  A retractor was put in place.  The uterus was then flipped.  Remainder of the pedicles were clamped and cut.  Uterus, tubes, and ovaries were passed off the operative field, held pedicles secured with free ties of 0 Vicryl.  The posterior vaginal cuff was run with a running locking suture of 0 Vicryl.  Areas of bleeding were brought under control with figure-of-8s of 0 Vicryl.  Vaginal mucosa was then reapproximated with interrupted  figure-of-8s of 0 Vicryl.  At this point in time, we had minimal to no bleeding.  The patient's legs were repositioned.  The bladder was emptied by in-and-out catheterization. Repeat laparoscopy was performed.  Areas of oozing from the vaginal cuff were brought under control by the bipolar.  Both areas where the ovaries had been removed were hemostatically intact.  There was no bleeding along the pelvic sidewall.  Again, we irrigated the pelvis at this point in time and removed the irrigation.  Visualized the areas carefully. There was no active bleeding.  We deflated the abdomen and visualized and there was no active bleeding.  The abdomen was re-insufflated with carbon dioxide.  All trocars were removed.  Subumbilical fascia was closed with 2 figure-of-8s of 0 Vicryl.  Skin was closed with interrupted subcuticulars of 4-0 Vicryl.  Suprapubic incision was closed with Dermabond.  The patient's legs were repositioned and cystoscopy was performed.  The bladder was intact.  Both ureteral orifices were visualized and noted to be spilling clear jets of urine.  At this point in time, the cystoscope was removed.  The Foley was placed to straight drain.  The patient was taken out of the dorsal lithotomy position.  Once alert, extubated and transferred to recovery room in good condition.  Sponge, instrument, and needle count was correct by circulating nurse multiple times.     Darlyn Chamber, M.D.     JSM/MEDQ  D:  11/09/2016  T:  11/10/2016  Job:  [id number]

## 2016-11-10 NOTE — Progress Notes (Signed)
1 Day Post-Op Procedure(s) (LRB): LAPAROSCOPIC ASSISTED VAGINAL HYSTERECTOMY WITH SALPINGECTOMY,  oophorectomy, cystoscopy (Bilateral)  Subjective: Patient reports tolerating PO.    Objective: I have reviewed patient's vital signs and labs.  General: alert GI: soft, non-tender; bowel sounds normal; no masses,  no organomegaly Vaginal Bleeding: minimal  Assessment: s/p Procedure(s): LAPAROSCOPIC ASSISTED VAGINAL HYSTERECTOMY WITH SALPINGECTOMY,  oophorectomy, cystoscopy (Bilateral): stable  Plan: Discharge home  LOS: 0 days    Herbert Marken S 11/10/2016, 7:58 AM

## 2016-11-10 NOTE — Progress Notes (Signed)
22 mg/mls Dilaudid PCA syringe wasted with Kirkland Hun, RN in sink

## 2016-11-10 NOTE — Discharge Summary (Signed)
NAME:  Morgan Barker, Morgan Barker                      ACCOUNT NO.:  MEDICAL RECORD NO.:  [id number]  LOCATION:                                 FACILITY:  PHYSICIAN:  Darlyn Chamber, M.D.        DATE OF BIRTH:  DATE OF ADMISSION:  11/09/2016 DATE OF DISCHARGE:  11/10/2016                              DISCHARGE SUMMARY   ADMITTING DIAGNOSIS:  Atypical endometrial hyperplasia.  DISCHARGE DIAGNOSIS:  Atypical endometrial hyperplasia.  PROCEDURE:  Laparoscopic-assisted vaginal hysterectomy, removal of both tubes and ovaries, and cystoscopy.  For complete history and physical, please see dictated note.  COURSE IN THE HOSPITAL:  The patient underwent above-noted surgery. There were no significant complications noted.  Postoperative hemoglobin was 11.7  On her first postoperative day, Foley had been discontinued, she had not voided yet.  Her abdomen was soft, nontender.  Bowel sounds active.  All incisions were clear.  No active vaginal bleeding.  In terms of complications, none were encountered during her stay in the hospital.  The patient was discharged home in stable condition.  DISPOSITION:  The patient is to avoid heavy lifting, vaginal entrance, or driving a car.  Instructed to call if there should be any heavy vaginal bleeding.  Nausea, vomiting, fever, or excessive pain.  Also instructed on signs and symptoms of deep venous thrombosis, pulmonary embolus.  Discharged home on Percocet as needed for pain.  PLAN:  Follow up in the office in 1 week.     Darlyn Chamber, M.D.     JSM/MEDQ  D:  11/10/2016  T:  11/10/2016  Job:  [id number]

## 2016-11-10 NOTE — Discharge Summary (Signed)
Patient name [name], [name] DICTATION# [id number] CSN# [id number]  [facility], MD 11/10/2016 7:58 AM

## 2016-12-14 ENCOUNTER — Ambulatory Visit (INDEPENDENT_AMBULATORY_CARE_PROVIDER_SITE_OTHER): Payer: BLUE CROSS/BLUE SHIELD | Admitting: Internal Medicine

## 2016-12-14 ENCOUNTER — Encounter: Payer: Self-pay | Admitting: Internal Medicine

## 2016-12-14 VITALS — BP 142/78 | HR 114 | Temp 98.8°F | Ht 60.0 in | Wt 153.4 lb

## 2016-12-14 DIAGNOSIS — I10 Essential (primary) hypertension: Secondary | ICD-10-CM

## 2016-12-14 DIAGNOSIS — R42 Dizziness and giddiness: Secondary | ICD-10-CM

## 2016-12-14 NOTE — Progress Notes (Signed)
Subjective:    Patient ID: Morgan Barker, female    DOB: 1965-10-18, 51 y.o.   MRN: [id number]  HPI 51 year old patient who has a history of essential hypertension. This has been well controlled on combination therapy The patient presents with a one-day history of dizziness.  This was first noted when she changes her position in bed and became vertiginous.  Symptoms have persisted somewhat throughout the morning but have improved and are very minor.  She also complains of some fullness in the ears and some mild sinus congestion No prior history of vertigo  Past Medical History:  Diagnosis Date  . Cervical stenosis (uterine cervix)   . Endometrial polyp    w/ hx polyps (complex hyperplasia w/ focal atyical)  . History of colon polyps    hyperplastic 2014  . Hyperlipidemia   . Hypertension   . RBBB (right bundle branch block)   . Wears contact lenses      Social History   Social History  . Marital status: Single    Spouse name: N/A  . Number of children: N/A  . Years of education: N/A   Occupational History  . Not on file.   Social History Main Topics  . Smoking status: Never Smoker  . Smokeless tobacco: Never Used  . Alcohol use No  . Drug use: No  . Sexual activity: Not on file   Other Topics Concern  . Not on file   Social History Narrative   Single. No children. Mother lives with patient.       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 CERVICAL BIOPSY  W/ LOOP ELECTRODE EXCISION    . COLONOSCOPY  last one 11-01-2012  . DILATATION & CURETTAGE/HYSTEROSCOPY WITH MYOSURE N/A 01/08/2016   Procedure: DILATATION & CURETTAGE/HYSTEROSCOPY WITH MYOSURE;  Surgeon: Arvella Nigh, MD;  Location: [place];  Service: Gynecology;  Laterality: N/A;  MYOSURE FOR POLYPS  . DILATATION & CURETTAGE/HYSTEROSCOPY WITH MYOSURE N/A 09/16/2016   Procedure:  DILATATION & CURETTAGE/HYSTEROSCOPY WITH MYOSURE;  Surgeon: Arvella Nigh, MD;  Location: [place];  Service: Gynecology;  Laterality: N/A;  . LAPAROSCOPIC CHOLECYSTECTOMY  10/08/2005  . LAPAROSCOPIC VAGINAL HYSTERECTOMY WITH SALPINGECTOMY Bilateral 11/09/2016   Procedure: LAPAROSCOPIC ASSISTED VAGINAL HYSTERECTOMY WITH SALPINGECTOMY,  oophorectomy, cystoscopy;  Surgeon: Arvella Nigh, MD;  Location: [facility];  Service: Gynecology;  Laterality: Bilateral;    Family History  Problem Relation Age of Onset  . Hypertension Mother   . Colon cancer Father        diagnosed 72    No Known Allergies  Current Outpatient Prescriptions on File Prior to Visit  Medication Sig Dispense Refill  . losartan-hydrochlorothiazide (HYZAAR) 100-12.5 MG tablet Take 1 tablet by mouth daily. 90 tablet 2  . Multiple Vitamin (MULTIVITAMIN WITH MINERALS) TABS tablet Take 1 tablet by mouth daily.    . [DISCONTINUED] hydrochlorothiazide (HYDRODIURIL) 25 MG tablet Take 1 tablet (25 mg total) by mouth daily. 30 tablet 1   No current facility-administered medications on file prior to visit.     BP (!) 142/78 (BP Location: Left Arm, Patient Position: Sitting, Cuff Size: Normal)   Pulse (!) 114   Temp 98.8 F (37.1 C) (Oral)   Ht 5' (1.524 m)   Wt 153 lb 6.4 oz (69.6 kg)   SpO2 98%   BMI 29.96 kg/m  Review of Systems  Constitutional: Negative.   HENT: Positive for congestion and sinus pressure. Negative for dental problem, hearing loss, rhinorrhea, sore throat and tinnitus.   Eyes: Negative for pain, discharge and visual disturbance.  Respiratory: Negative for cough and shortness of breath.   Cardiovascular: Negative for chest pain, palpitations and leg swelling.  Gastrointestinal: Negative for abdominal distention, abdominal pain, blood in stool, constipation, diarrhea, nausea and vomiting.  Genitourinary: Negative for difficulty urinating, dysuria, flank pain,  frequency, hematuria, pelvic pain, urgency, vaginal bleeding, vaginal discharge and vaginal pain.  Musculoskeletal: Negative for arthralgias, gait problem and joint swelling.  Skin: Negative for rash.  Neurological: Positive for dizziness. Negative for syncope, speech difficulty, weakness, numbness and headaches.  Hematological: Negative for adenopathy.  Psychiatric/Behavioral: Negative for agitation, behavioral problems and dysphoric mood. The patient is not nervous/anxious.        Objective:   Physical Exam  Constitutional: She appears well-developed and well-nourished. No distress.  HENT:  Right Ear: External ear normal.  Left Ear: External ear normal.  Mouth/Throat: Oropharynx is clear and moist.  Eyes: Pupils are equal, round, and reactive to light. EOM are normal.  Neck: Normal range of motion.  Cardiovascular: Normal rate and regular rhythm.   Neurological: She is alert. No cranial nerve deficit. Coordination normal.  Normal finger to nose testing Normal gait  Extraocular muscles full          Assessment & Plan:   Paroxysmal positional vertigo.  Symptoms are minor and seemed to be largely resolved.  Patient information dispensed and condition discussed She will report any new or worsening symptoms No medications recommended at this time other than decongestants.  Morgan Barker

## 2016-12-14 NOTE — Patient Instructions (Addendum)
Benign Positional Vertigo Vertigo is the feeling that you or your surroundings are moving when they are not. Benign positional vertigo is the most common form of vertigo. The cause of this condition is not serious (is benign). This condition is triggered by certain movements and positions (is positional). This condition can be dangerous if it occurs while you are doing something that could endanger you or others, such as driving. What are the causes? In many cases, the cause of this condition is not known. It may be caused by a disturbance in an area of the inner ear that helps your brain to sense movement and balance. This disturbance can be caused by a viral infection (labyrinthitis), head injury, or repetitive motion. What increases the risk? This condition is more likely to develop in:  Women.  People who are 50 years of age or older.  What are the signs or symptoms? Symptoms of this condition usually happen when you move your head or your eyes in different directions. Symptoms may start suddenly, and they usually last for less than a minute. Symptoms may include:  Loss of balance and falling.  Feeling like you are spinning or moving.  Feeling like your surroundings are spinning or moving.  Nausea and vomiting.  Blurred vision.  Dizziness.  Involuntary eye movement (nystagmus).  Symptoms can be mild and cause only slight annoyance, or they can be severe and interfere with daily life. Episodes of benign positional vertigo may return (recur) over time, and they may be triggered by certain movements. Symptoms may improve over time. How is this diagnosed? This condition is usually diagnosed by medical history and a physical exam of the head, neck, and ears. You may be referred to a health care provider who specializes in ear, nose, and throat (ENT) problems (otolaryngologist) or a provider who specializes in disorders of the nervous system (neurologist). You may have additional testing,  including:  MRI.  A CT scan.  Eye movement tests. Your health care provider may ask you to change positions quickly while he or she watches you for symptoms of benign positional vertigo, such as nystagmus. Eye movement may be tested with an electronystagmogram (ENG), caloric stimulation, the Dix-Hallpike test, or the roll test.  An electroencephalogram (EEG). This records electrical activity in your brain.  Hearing tests.  How is this treated? Usually, your health care provider will treat this by moving your head in specific positions to adjust your inner ear back to normal. Surgery may be needed in severe cases, but this is rare. In some cases, benign positional vertigo may resolve on its own in 2-4 weeks. Follow these instructions at home: Safety  Move slowly.Avoid sudden body or head movements.  Avoid driving.  Avoid operating heavy machinery.  Avoid doing any tasks that would be dangerous to you or others if a vertigo episode would occur.  If you have trouble walking or keeping your balance, try using a cane for stability. If you feel dizzy or unstable, sit down right away.  Return to your normal activities as told by your health care provider. Ask your health care provider what activities are safe for you. General instructions  Take over-the-counter and prescription medicines only as told by your health care provider.  Avoid certain positions or movements as told by your health care provider.  Drink enough fluid to keep your urine clear or pale yellow.  Keep all follow-up visits as told by your health care provider. This is important. Contact a health care   provider if:  You have a fever.  Your condition gets worse or you develop new symptoms.  Your family or friends notice any behavioral changes.  Your nausea or vomiting gets worse.  You have numbness or a "pins and needles" sensation. Get help right away if:  You have difficulty speaking or moving.  You are  always dizzy.  You faint.  You develop severe headaches.  You have weakness in your legs or arms.  You have changes in your hearing or vision.  You develop a stiff neck.  You develop sensitivity to light. This information is not intended to replace advice given to you by your health care provider. Make sure you discuss any questions you have with your health care provider.   Hydrate and Humidify  Drink enough water to keep your urine clear or pale yellow. Staying hydrated will help to thin your mucus.  Use a cool mist humidifier to keep the humidity level in your home above 50%.  Inhale steam for 10-15 minutes, 3-4 times a day or as told by your health care provider. You can do this in the bathroom while a hot shower is running.   Use saline irrigation, warm  moist compresses and over-the-counter decongestants only as directed.

## 2017-02-10 ENCOUNTER — Other Ambulatory Visit: Payer: BLUE CROSS/BLUE SHIELD

## 2017-02-17 ENCOUNTER — Encounter: Payer: BLUE CROSS/BLUE SHIELD | Admitting: Family Medicine

## 2017-02-18 ENCOUNTER — Ambulatory Visit (INDEPENDENT_AMBULATORY_CARE_PROVIDER_SITE_OTHER): Payer: BLUE CROSS/BLUE SHIELD | Admitting: Family Medicine

## 2017-02-18 ENCOUNTER — Encounter: Payer: Self-pay | Admitting: Family Medicine

## 2017-02-18 VITALS — BP 108/76 | HR 71 | Temp 98.4°F | Ht 60.0 in | Wt 158.0 lb

## 2017-02-18 DIAGNOSIS — E785 Hyperlipidemia, unspecified: Secondary | ICD-10-CM

## 2017-02-18 DIAGNOSIS — D72829 Elevated white blood cell count, unspecified: Secondary | ICD-10-CM

## 2017-02-18 DIAGNOSIS — Z0000 Encounter for general adult medical examination without abnormal findings: Secondary | ICD-10-CM | POA: Diagnosis not present

## 2017-02-18 DIAGNOSIS — I10 Essential (primary) hypertension: Secondary | ICD-10-CM

## 2017-02-18 LAB — LIPID PANEL
Cholesterol: 209 mg/dL — ABNORMAL HIGH (ref <200)
HDL: 47 mg/dL — ABNORMAL LOW (ref >50)
LDL Cholesterol (Calc): 141 mg/dL (calc) — ABNORMAL HIGH
NON-HDL CHOLESTEROL (CALC): 162 mg/dL — AB (ref <130)
TRIGLYCERIDES: 97 mg/dL (ref <150)
Total CHOL/HDL Ratio: 4.4 (calc) (ref <5.0)

## 2017-02-18 LAB — COMPREHENSIVE METABOLIC PANEL
AG Ratio: 1.4 (calc) (ref 1.0–2.5)
ALBUMIN MSPROF: 4.7 g/dL (ref 3.6–5.1)
ALKALINE PHOSPHATASE (APISO): 60 U/L (ref 33–115)
ALT: 19 U/L (ref 6–29)
AST: 18 U/L (ref 10–35)
BILIRUBIN TOTAL: 1.3 mg/dL — AB (ref 0.2–1.2)
BUN: 11 mg/dL (ref 7–25)
CALCIUM: 10.2 mg/dL (ref 8.6–10.2)
CHLORIDE: 99 mmol/L (ref 98–110)
CO2: 26 mmol/L (ref 20–32)
CREATININE: 0.7 mg/dL (ref 0.50–1.10)
Globulin: 3.3 g/dL (calc) (ref 1.9–3.7)
Glucose, Bld: 84 mg/dL (ref 65–99)
POTASSIUM: 4.3 mmol/L (ref 3.5–5.3)
Sodium: 137 mmol/L (ref 135–146)
Total Protein: 8 g/dL (ref 6.1–8.1)

## 2017-02-18 LAB — CBC WITH DIFFERENTIAL/PLATELET
Basophils Absolute: 43 cells/uL (ref 0–200)
Basophils Relative: 0.6 %
Eosinophils Absolute: 29 cells/uL (ref 15–500)
Eosinophils Relative: 0.4 %
HEMATOCRIT: 42.6 % (ref 35.0–45.0)
Hemoglobin: 14.3 g/dL (ref 11.7–15.5)
LYMPHS ABS: 2808 {cells}/uL (ref 850–3900)
MCH: 27.3 pg (ref 27.0–33.0)
MCHC: 33.6 g/dL (ref 32.0–36.0)
MCV: 81.5 fL (ref 80.0–100.0)
MPV: 11 fL (ref 7.5–12.5)
Monocytes Relative: 6.2 %
NEUTROS PCT: 53.8 %
Neutro Abs: 3874 cells/uL (ref 1500–7800)
PLATELETS: 233 10*3/uL (ref 140–400)
RBC: 5.23 10*6/uL — AB (ref 3.80–5.10)
RDW: 12.9 % (ref 11.0–15.0)
TOTAL LYMPHOCYTE: 39 %
WBC: 7.2 10*3/uL (ref 3.8–10.8)
WBCMIX: 446 {cells}/uL (ref 200–950)

## 2017-02-18 NOTE — Patient Instructions (Addendum)
Everything looks good other than weight trend Wt Readings from Last 3 Encounters:  02/18/17 158 lb (71.7 kg)  12/14/16 153 lb 6.4 oz (69.6 kg)  11/09/16 156 lb (70.8 kg)  Have to fight through those cravings- sorry your surgery seems to have set you back- hope you can get back onto your progress and set goal of at least 5-10 lbs off in next 6 months

## 2017-02-18 NOTE — Addendum Note (Signed)
Addended by: Kayren Eaves T on: 02/18/2017 03:16 PM   Modules accepted: Orders

## 2017-02-18 NOTE — Assessment & Plan Note (Signed)
Leukocytosis- mild elevation WBC. Seen by hematology over 10 years ago- was told blood coagulates quickly. We will monitor as well as Wbc under 12k

## 2017-02-18 NOTE — Progress Notes (Addendum)
Phone: [phone]  Subjective:  Patient presents today for their annual physical. Chief complaint-noted.   See problem oriented charting- ROS- full  review of systems was completed and negative including No chest pain or shortness of breath. No headache or blurry vision.   The following were reviewed and entered/updated in epic: Past Medical History:  Diagnosis Date  . Cervical stenosis (uterine cervix)   . Endometrial polyp    w/ hx polyps (complex hyperplasia w/ focal atyical)  . History of colon polyps    hyperplastic 2014  . Hyperlipidemia   . Hypertension   . RBBB (right bundle branch block)   . Wears contact lenses    Patient Active Problem List   Diagnosis Date Noted  . Leukocytosis 02/16/2016    Priority: High  . Hyperlipidemia 10/15/2014    Priority: Medium  . HTN (hypertension) 07/02/2011    Priority: Medium  . History of colonic polyps 06/20/2014    Priority: Low  . History of abnormal cervical Pap smear 06/20/2014    Priority: Low  . Obesity (BMI 30-39.9) 07/06/2013    Priority: Low  . NECK AND BACK PAIN 12/11/2007    Priority: Low  . Alopecia 03/17/2007    Priority: Low  . S/P laparoscopic assisted vaginal hysterectomy (LAVH) 11/09/2016  . Complex endometrial hyperplasia 01/21/2016  . Endometrial polyp 01/08/2016    Class: Present on Admission   Past Surgical History:  Procedure Laterality Date  . CERVICAL BIOPSY  W/ LOOP ELECTRODE EXCISION    . COLONOSCOPY  last one 11-01-2012  . DILATATION & CURETTAGE/HYSTEROSCOPY WITH MYOSURE N/A 01/08/2016   Procedure: DILATATION & CURETTAGE/HYSTEROSCOPY WITH MYOSURE;  Surgeon: Arvella Nigh, MD;  Location: [place];  Service: Gynecology;  Laterality: N/A;  MYOSURE FOR POLYPS  . DILATATION & CURETTAGE/HYSTEROSCOPY WITH MYOSURE N/A 09/16/2016   Procedure: DILATATION & CURETTAGE/HYSTEROSCOPY WITH MYOSURE;  Surgeon: Arvella Nigh, MD;  Location: [place];  Service: Gynecology;   Laterality: N/A;  . LAPAROSCOPIC CHOLECYSTECTOMY  10/08/2005  . LAPAROSCOPIC VAGINAL HYSTERECTOMY WITH SALPINGECTOMY Bilateral 11/09/2016   Procedure: LAPAROSCOPIC ASSISTED VAGINAL HYSTERECTOMY WITH SALPINGECTOMY,  oophorectomy, cystoscopy;  Surgeon: Arvella Nigh, MD;  Location: [facility];  Service: Gynecology;  Laterality: Bilateral;    Family History  Problem Relation Age of Onset  . Hypertension Mother   . Colon cancer Father        diagnosed 64    Medications- reviewed and updated Current Outpatient Prescriptions  Medication Sig Dispense Refill  . losartan-hydrochlorothiazide (HYZAAR) 100-12.5 MG tablet Take 1 tablet by mouth daily. 90 tablet 2  . MINIVELLE 0.05 MG/24HR patch Place 1 patch onto the skin 2 (two) times a week.    . Multiple Vitamin (MULTIVITAMIN WITH MINERALS) TABS tablet Take 1 tablet by mouth daily.     No current facility-administered medications for this visit.     Allergies-reviewed and updated No Known Allergies  Social History   Social History  . Marital status: Single    Spouse name: N/A  . Number of children: N/A  . Years of education: N/A   Social History Main Topics  . Smoking status: Never Smoker  . Smokeless tobacco: Never Used  . Alcohol use No  . Drug use: No  . Sexual activity: Not Asked   Other Topics Concern  . None   Social History Narrative   Single. No children. Mother lives with patient.       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 Oropharynx normal Neck: no thyromegaly CV: RRR no murmurs rubs or gallops Lungs: CTAB no crackles, wheeze, rhonchi Abdomen:  soft/nontender/nondistended/normal bowel sounds. No rebound or guarding.  Ext: no edema Skin: warm, dry Neuro: grossly normal, moves all extremities, PERRLA  Assessment/Plan:  51 y.o. female presenting for annual physical.  Health Maintenance counseling: 1. Anticipatory guidance: Patient counseled regarding regular dental exams -q6 months, eye exams - yearly- has December appointemtn, wearing seatbelts.  2. Risk factor reduction:  Advised patient of need for regular exercise and diet rich and fruits and vegetables to reduce risk of heart attack and stroke. Exercise- still walking. Diet-after her surgery appetite has been very increased. Has intense sweet cravings Wt Readings from Last 3 Encounters:  02/18/17 158 lb (71.7 kg)  12/14/16 153 lb 6.4 oz (69.6 kg)  11/09/16 156 lb (70.8 kg)  3. Immunizations/screenings/ancillary studies- discussed shingrix shot once over age 24 Immunization History  Administered Date(s) Administered  . Influenza-Unspecified 01/24/2013, 01/24/2014, 02/18/2016, 02/02/2017  . Td 02/24/2006  . Tdap 02/16/2016  4. Cervical cancer screening- 09/12/14 with 3 year follow up. No history abnormal. Did have hysterectomy in July- was told at least have pap smear next year. Was benign so consideration of stopping.  5. Breast cancer screening-  breast exam with GYN and mammogram at GYN 09/15/15. Also had one may of this year- do not have records- will request next year.  6. Colon cancer screening - Family history plus she has had polyps. Did one at 4 and 76, will get repeat next year in July likely.   7. Birth control/STD check- hysterectomy. No new sexual partners  Status of chronic or acute concerns   Leukocytosis Leukocytosis- mild elevation WBC. Seen by hematology over 10 years ago- was told blood coagulates quickly. We will monitor as well as Wbc under 12k  HTN (hypertension) HTN- at goal on losartan hctz 100-12.5mg   Hyperlipidemia HLD- had lost 3 lbs now up 5  lbs. Last 10 year risk 3.7%. Update this year. Focus on weight loss.  10 year risk came back only 2.1% so will monitor lipids only and focus on weight loss  6 month weight and BP check  Orders Placed This Encounter  Procedures  . Lipid panel    Standing Status:   Future    Standing Expiration Date:   02/18/2018  . Urinalysis    Standing Status:   Future    Standing Expiration Date:   02/18/2018  . Comprehensive metabolic panel    [facility]    Standing Status:   Future    Standing Expiration Date:   02/18/2018  . CBC with Differential/Platelet    Standing Status:   Future    Standing Expiration Date:   02/18/2018    Meds ordered this encounter  Medications  . MINIVELLE 0.05 MG/24HR patch    Sig: Place 1 patch onto the skin 2 (two) times a week.    Return precautions advised.  Garret Reddish, MD

## 2017-02-18 NOTE — Assessment & Plan Note (Signed)
HLD- had lost 3 lbs now up 5 lbs. Last 10 year risk 3.7%. Update this year. Focus on weight loss.

## 2017-02-18 NOTE — Addendum Note (Signed)
Addended by: Frutoso Chase A on: 02/18/2017 03:19 PM   Modules accepted: Orders

## 2017-02-18 NOTE — Assessment & Plan Note (Signed)
HTN- at goal on losartan hctz 100-12.5mg 

## 2017-02-19 LAB — URINALYSIS, ROUTINE W REFLEX MICROSCOPIC
BACTERIA UA: NONE SEEN /HPF
BILIRUBIN URINE: NEGATIVE
Glucose, UA: NEGATIVE
Hgb urine dipstick: NEGATIVE
Hyaline Cast: NONE SEEN /LPF
KETONES UR: NEGATIVE
NITRITE: NEGATIVE
PROTEIN: NEGATIVE
RBC / HPF: NONE SEEN /HPF (ref 0–2)
SPECIFIC GRAVITY, URINE: 1.004 (ref 1.001–1.03)
SQUAMOUS EPITHELIAL / LPF: NONE SEEN /HPF (ref <=5)
pH: 6 (ref 5.0–8.0)

## 2017-03-09 ENCOUNTER — Other Ambulatory Visit: Payer: Self-pay | Admitting: Family Medicine

## 2017-08-19 ENCOUNTER — Ambulatory Visit: Payer: BLUE CROSS/BLUE SHIELD | Admitting: Family Medicine

## 2017-09-22 DIAGNOSIS — Z1231 Encounter for screening mammogram for malignant neoplasm of breast: Secondary | ICD-10-CM | POA: Diagnosis not present

## 2017-09-22 DIAGNOSIS — Z01419 Encounter for gynecological examination (general) (routine) without abnormal findings: Secondary | ICD-10-CM | POA: Diagnosis not present

## 2017-09-22 DIAGNOSIS — Z6833 Body mass index (BMI) 33.0-33.9, adult: Secondary | ICD-10-CM | POA: Diagnosis not present

## 2017-09-22 LAB — HM MAMMOGRAPHY

## 2017-09-26 LAB — HM PAP SMEAR: HM Pap smear: NEGATIVE

## 2017-10-13 DIAGNOSIS — Z13820 Encounter for screening for osteoporosis: Secondary | ICD-10-CM | POA: Diagnosis not present

## 2017-11-24 ENCOUNTER — Other Ambulatory Visit: Payer: Self-pay | Admitting: Family Medicine

## 2017-12-22 DIAGNOSIS — Z800 Family history of malignant neoplasm of digestive organs: Secondary | ICD-10-CM | POA: Diagnosis not present

## 2017-12-22 DIAGNOSIS — K644 Residual hemorrhoidal skin tags: Secondary | ICD-10-CM | POA: Diagnosis not present

## 2017-12-22 DIAGNOSIS — K635 Polyp of colon: Secondary | ICD-10-CM | POA: Diagnosis not present

## 2017-12-22 DIAGNOSIS — K640 First degree hemorrhoids: Secondary | ICD-10-CM | POA: Diagnosis not present

## 2017-12-22 DIAGNOSIS — Z1211 Encounter for screening for malignant neoplasm of colon: Secondary | ICD-10-CM | POA: Diagnosis not present

## 2017-12-22 LAB — HM COLONOSCOPY

## 2017-12-27 DIAGNOSIS — Z1211 Encounter for screening for malignant neoplasm of colon: Secondary | ICD-10-CM | POA: Diagnosis not present

## 2017-12-27 DIAGNOSIS — K635 Polyp of colon: Secondary | ICD-10-CM | POA: Diagnosis not present

## 2017-12-30 ENCOUNTER — Encounter: Payer: Self-pay | Admitting: Family Medicine

## 2018-04-11 ENCOUNTER — Ambulatory Visit (INDEPENDENT_AMBULATORY_CARE_PROVIDER_SITE_OTHER): Payer: BLUE CROSS/BLUE SHIELD | Admitting: Family Medicine

## 2018-04-11 ENCOUNTER — Encounter: Payer: Self-pay | Admitting: Family Medicine

## 2018-04-11 VITALS — BP 128/82 | HR 75 | Temp 97.9°F | Ht 60.0 in | Wt 171.6 lb

## 2018-04-11 DIAGNOSIS — Z90710 Acquired absence of both cervix and uterus: Secondary | ICD-10-CM | POA: Diagnosis not present

## 2018-04-11 DIAGNOSIS — Z0000 Encounter for general adult medical examination without abnormal findings: Secondary | ICD-10-CM | POA: Diagnosis not present

## 2018-04-11 DIAGNOSIS — Z23 Encounter for immunization: Secondary | ICD-10-CM

## 2018-04-11 DIAGNOSIS — E785 Hyperlipidemia, unspecified: Secondary | ICD-10-CM | POA: Diagnosis not present

## 2018-04-11 LAB — URINALYSIS
Bilirubin Urine: NEGATIVE
Hgb urine dipstick: NEGATIVE
KETONES UR: NEGATIVE
Leukocytes, UA: NEGATIVE
Nitrite: NEGATIVE
PH: 5.5 (ref 5.0–8.0)
Total Protein, Urine: NEGATIVE
URINE GLUCOSE: NEGATIVE
Urobilinogen, UA: 0.2 (ref 0.0–1.0)

## 2018-04-11 LAB — CBC
HEMATOCRIT: 42.6 % (ref 36.0–46.0)
Hemoglobin: 14 g/dL (ref 12.0–15.0)
MCHC: 32.9 g/dL (ref 30.0–36.0)
MCV: 84.2 fl (ref 78.0–100.0)
Platelets: 310 10*3/uL (ref 150.0–400.0)
RBC: 5.06 Mil/uL (ref 3.87–5.11)
RDW: 13.8 % (ref 11.5–15.5)
WBC: 7.8 10*3/uL (ref 4.0–10.5)

## 2018-04-11 LAB — LIPID PANEL
CHOL/HDL RATIO: 6
CHOLESTEROL: 220 mg/dL — AB (ref 0–200)
HDL: 38.9 mg/dL — ABNORMAL LOW (ref >39.00)
NonHDL: 180.71
Triglycerides: 228 mg/dL — ABNORMAL HIGH (ref 0.0–149.0)
VLDL: 45.6 mg/dL — ABNORMAL HIGH (ref 0.0–40.0)

## 2018-04-11 LAB — COMPREHENSIVE METABOLIC PANEL
ALT: 21 U/L (ref 0–35)
AST: 16 U/L (ref 0–37)
Albumin: 4.8 g/dL (ref 3.5–5.2)
Alkaline Phosphatase: 52 U/L (ref 39–117)
BILIRUBIN TOTAL: 1.1 mg/dL (ref 0.2–1.2)
BUN: 10 mg/dL (ref 6–23)
CALCIUM: 10.2 mg/dL (ref 8.4–10.5)
CO2: 28 meq/L (ref 19–32)
CREATININE: 0.6 mg/dL (ref 0.40–1.20)
Chloride: 101 mEq/L (ref 96–112)
GFR: 135.64 mL/min (ref >60.00)
GLUCOSE: 85 mg/dL (ref 70–99)
Potassium: 3.6 mEq/L (ref 3.5–5.1)
Sodium: 137 mEq/L (ref 135–145)
Total Protein: 8 g/dL (ref 6.0–8.3)

## 2018-04-11 LAB — LDL CHOLESTEROL, DIRECT: Direct LDL: 136 mg/dL

## 2018-04-11 NOTE — Addendum Note (Signed)
Addended by: Mariam Dollar, Roselyn Reef M on: 04/11/2018 01:55 PM   Modules accepted: Orders

## 2018-04-11 NOTE — Progress Notes (Signed)
Phone: [phone]  Subjective:  Patient presents today for their annual physical. Chief complaint-noted.   See problem oriented charting-  ROS   full  review of systems was completed and negative  Except for occasional mid back pain or acid reflux (alka seltzer tablets help)  The following were reviewed and entered/updated in epic: Past Medical History:  Diagnosis Date  . Cervical stenosis (uterine cervix)   . Endometrial polyp    w/ hx polyps (complex hyperplasia w/ focal atyical)  . History of colon polyps    hyperplastic 2014  . Hyperlipidemia   . Hypertension   . RBBB (right bundle branch block)   . Wears contact lenses    Patient Active Problem List   Diagnosis Date Noted  . Leukocytosis 02/16/2016    Priority: High  . Hyperlipidemia 10/15/2014    Priority: Medium  . HTN (hypertension) 07/02/2011    Priority: Medium  . History of colonic polyps 06/20/2014    Priority: Low  . History of abnormal cervical Pap smear 06/20/2014    Priority: Low  . Obesity (BMI 30-39.9) 07/06/2013    Priority: Low  . NECK AND BACK PAIN 12/11/2007    Priority: Low  . Alopecia 03/17/2007    Priority: Low  . S/P total hysterectomy 11/09/2016  . Complex endometrial hyperplasia 01/21/2016  . Endometrial polyp 01/08/2016    Class: Present on Admission   Past Surgical History:  Procedure Laterality Date  . CERVICAL BIOPSY  W/ LOOP ELECTRODE EXCISION    . COLONOSCOPY  last one 11-01-2012  . DILATATION & CURETTAGE/HYSTEROSCOPY WITH MYOSURE N/A 01/08/2016   Procedure: DILATATION & CURETTAGE/HYSTEROSCOPY WITH MYOSURE;  Surgeon: Arvella Nigh, MD;  Location: [facility];  Service: Gynecology;  Laterality: N/A;  MYOSURE FOR POLYPS  . DILATATION & CURETTAGE/HYSTEROSCOPY WITH MYOSURE N/A 09/16/2016   Procedure: DILATATION & CURETTAGE/HYSTEROSCOPY WITH MYOSURE;  Surgeon: Arvella Nigh, MD;  Location: [facility];  Service: Gynecology;  Laterality: N/A;  . LAPAROSCOPIC  CHOLECYSTECTOMY  10/08/2005  . LAPAROSCOPIC VAGINAL HYSTERECTOMY WITH SALPINGECTOMY Bilateral 11/09/2016   Procedure: LAPAROSCOPIC ASSISTED VAGINAL HYSTERECTOMY WITH SALPINGECTOMY,  oophorectomy, cystoscopy;  Surgeon: Arvella Nigh, MD;  Location: [facility];  Service: Gynecology;  Laterality: Bilateral;    Family History  Problem Relation Age of Onset  . Hypertension Mother   . Colon cancer Father        diagnosed 35    Medications- reviewed and updated Current Outpatient Medications  Medication Sig Dispense Refill  . losartan-hydrochlorothiazide (HYZAAR) 100-12.5 MG tablet TAKE 1 TABLET DAILY 90 tablet 2  . MINIVELLE 0.05 MG/24HR patch Place 1 patch onto the skin 2 (two) times a week.    . Multiple Vitamin (MULTIVITAMIN WITH MINERALS) TABS tablet Take 1 tablet by mouth daily.     No current facility-administered medications for this visit.     Allergies-reviewed and updated No Known Allergies  Social History   Social History Narrative   Single. No children. Mother lives with patient.       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 Oropharynx normal Neck: no thyromegaly CV: RRR no murmurs rubs or gallops Lungs: CTAB no crackles, wheeze, rhonchi Abdomen: soft/nontender/nondistended/normal bowel sounds. No rebound or guarding.  Ext: no edema Skin: warm, dry Neuro: grossly normal, moves all extremities, PERRLA  Assessment/Plan:  52 y.o. female presenting for annual physical.  Health Maintenance counseling: 1. Anticipatory guidance: Patient counseled regarding regular dental exams -q6 months,  eye exams - yearly- on friday,  avoiding smoking and second hand smoke , limiting alcohol to 1 beverage per day- none .   2. Risk factor reduction:  Advised patient of need for regular exercise and diet rich and fruits and vegetables to reduce risk of heart attack and stroke. Exercise- had been walking at work but it has really been crazy and group is not getting together right now. Diet-slipping some in relation to work schedule- gets home so late has to eat right before bed which makes it challenging- struggling to find time to cook. Encouraged her to find time as able for regular exercise and healthy eating.  Wt Readings from Last 3 Encounters:  04/11/18 171 lb 9.6 oz (77.8 kg)  02/18/17 158 lb (71.7 kg)  12/14/16 153 lb 6.4 oz (69.6 kg)  3. Immunizations/screenings/ancillary studies-already had flu shot at work.  Offered Shingrix today- she opts in.  Never had bad case of chicken pox but we reviewed cdc datat for ptaients over 40 with 99% chance of being exposed previously Immunization History  Administered Date(s) Administered  . Influenza-Unspecified 01/24/2013, 01/24/2014, 02/18/2016, 02/02/2017, 01/26/2018  . Td 02/24/2006  . Tdap 02/16/2016  4. Cervical cancer screening-last on file from 09/12/2014 from gynecology- had hysterectomy in 2018.  Removed for benign reasons.  She reports total hysterectomy including cervix.  No longer needs Pap smear per usptf recommendations but she is working with her gynecologist due to prior abnormal paps- had one in may and we are going to try to get a copy of this  5. Breast cancer screening-  breast exam with gynecology and mammogram - she has had a mammogram within the last year-will get records 6. Colon cancer screening - early screening at 67 or 45due to family history  plus she has had polyps.  She had another colonoscopy in August 2019- hyperplastic but with family history still plans on 5-year repeat in 2024  7. Skin cancer screening- has dermatology  visit due to skin growth on right temple area. advised regular sunscreen use. Denies worrisome, changing, or new skin lesions.  8. Birth control/STD check-hysterectomy and no new sexual partners . On estradiol patch through GYN 9. Osteoporosis screening at 65-we will plan on this- had bone density with gynecology- reports normal.  Taking vitamin D  10.  Never smoker  Status of chronic or acute concerns   Stressors 1. Work has been very busy 2. Helping mom with second knee replacement 3. 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  4. Eating late as gets home 7 or 8 - getting up at 4 Am and has found that difficult to maintain weight with [place]  Mild leukocytosis- very mild WBC elevations in the past-has seen hematology over 10 years ago- she was told her blood coagulates quickly.  We are going to continue to monitor as long as white count remains below 12,000- 13,000.  Was normal on last check-continue to monitor including today Lab Results  Component Value Date   WBC 7.2 02/18/2017   HGB 14.3 02/18/2017  HCT 42.6 02/18/2017   MCV 81.5 02/18/2017   PLT 233 02/18/2017   Hypertension-remains controlled on losartan hydrochlorothiazide 100-12.5 mg. She would like to get updated urine with labs as well.   Hyperlipidemia- ten-year risk is been below 5%.  We have focused on lifestyle choices over starting medication- with weight gain may be up but would want to give her some time to work on this as per discussion above.   Zyrtec helps with allergies  No problem-specific Assessment & Plan notes found for this encounter.  No future appointments. No follow-ups on file.  Lab/Order associations: last food at 7 AM so 6.5 hours fasting at this point  Preventative health care  Hyperlipidemia, unspecified hyperlipidemia type  S/P total hysterectomy  No orders of the defined types were placed in this encounter.   Return precautions advised.  Garret Reddish, MD

## 2018-04-11 NOTE — Patient Instructions (Addendum)
Health Maintenance Due  Topic Date Due  . PAP SMEAR- getting records 09/01/2017  . MAMMOGRAM - getting records 09/14/2017   Shingrix #1 today. Repeat injection in 2-5 months. Schedule this nurse visit before you leave.   Lets set a goal of 5-10 lbs weight loss by 6 month follow up.   Please stop by lab before you go

## 2018-04-11 NOTE — Addendum Note (Signed)
Addended by: Ezequiel Kayser T on: 04/11/2018 01:54 PM   Modules accepted: Orders

## 2018-04-12 ENCOUNTER — Encounter: Payer: Self-pay | Admitting: Family Medicine

## 2018-04-17 DIAGNOSIS — L811 Chloasma: Secondary | ICD-10-CM | POA: Diagnosis not present

## 2018-04-17 DIAGNOSIS — L720 Epidermal cyst: Secondary | ICD-10-CM | POA: Diagnosis not present

## 2018-04-17 DIAGNOSIS — L608 Other nail disorders: Secondary | ICD-10-CM | POA: Diagnosis not present

## 2018-04-17 DIAGNOSIS — L603 Nail dystrophy: Secondary | ICD-10-CM | POA: Diagnosis not present

## 2018-05-09 DIAGNOSIS — L7211 Pilar cyst: Secondary | ICD-10-CM | POA: Diagnosis not present

## 2018-05-22 ENCOUNTER — Other Ambulatory Visit: Payer: Self-pay | Admitting: Family Medicine

## 2018-06-13 ENCOUNTER — Ambulatory Visit (INDEPENDENT_AMBULATORY_CARE_PROVIDER_SITE_OTHER): Payer: BLUE CROSS/BLUE SHIELD

## 2018-06-13 DIAGNOSIS — Z23 Encounter for immunization: Secondary | ICD-10-CM | POA: Diagnosis not present

## 2018-10-13 ENCOUNTER — Encounter: Payer: Self-pay | Admitting: Family Medicine

## 2018-10-13 ENCOUNTER — Ambulatory Visit (INDEPENDENT_AMBULATORY_CARE_PROVIDER_SITE_OTHER): Payer: BC Managed Care – PPO | Admitting: Family Medicine

## 2018-10-13 VITALS — Ht 60.0 in | Wt 166.2 lb

## 2018-10-13 DIAGNOSIS — E669 Obesity, unspecified: Secondary | ICD-10-CM

## 2018-10-13 DIAGNOSIS — I10 Essential (primary) hypertension: Secondary | ICD-10-CM | POA: Diagnosis not present

## 2018-10-13 DIAGNOSIS — E785 Hyperlipidemia, unspecified: Secondary | ICD-10-CM | POA: Diagnosis not present

## 2018-10-13 MED ORDER — LOSARTAN POTASSIUM-HCTZ 100-12.5 MG PO TABS: ORAL_TABLET | ORAL | 3 refills | Status: DC

## 2018-10-13 NOTE — Patient Instructions (Addendum)
Health Maintenance Due  Topic Date Due  . PAP SMEAR-patient has had this with gynecology-we did not get records after last visit-can we attempt again? Scheduled for July 6th 2020 09/01/2017  . MAMMOGRAM-patient has had this with gynecology-we did not get records after last visit-can we attempt again? 09/14/2017   Depression screen [facility] 2/9 04/11/2018 02/18/2017  Decreased Interest 0 0  Down, Depressed, Hopeless 0 0  PHQ - 2 Score 0 0

## 2018-10-13 NOTE — Progress Notes (Deleted)
duplicate

## 2018-10-13 NOTE — Progress Notes (Signed)
Phone [phone]   Subjective:  Virtual visit via Video note. Chief complaint: Chief Complaint  Patient presents with  . Hypertension   This visit type was conducted due to national recommendations for restrictions regarding the COVID-19 Pandemic (e.g. social distancing).  This format is felt to be most appropriate for this patient at this time balancing risks to patient and risks to population by having him in for in person visit.  No physical exam was performed (except for noted visual exam or audio findings with Telehealth visits).    Our team/I connected with Morgan Barker at  8:20 AM EDT by a video enabled telemedicine application ([email or web address] or caregility through epic) and verified that I am speaking with the correct person using two identifiers.  Location patient: [facility] Location provider: [facility], office Persons participating in the virtual visit:  patient  Our team/I discussed the limitations of evaluation and management by telemedicine and the availability of in person appointments. In light of current covid-19 pandemic, patient also understands that we are trying to protect them by minimizing in office contact if at all possible.  The patient expressed consent for telemedicine visit and agreed to proceed. Patient understands insurance will be billed.   ROS-  Denies HA, dizziness, visual changes, CP, SOB.    Past Medical History-  Patient Active Problem List   Diagnosis Date Noted  . Leukocytosis 02/16/2016    Priority: High  . Hyperlipidemia 10/15/2014    Priority: Medium  . HTN (hypertension) 07/02/2011    Priority: Medium  . S/P total hysterectomy 11/09/2016    Priority: Low  . History of colonic polyps 06/20/2014    Priority: Low  . History of abnormal cervical Pap smear 06/20/2014    Priority: Low  . Obesity (BMI 30-39.9) 07/06/2013    Priority: Low  . NECK AND BACK PAIN 12/11/2007    Priority: Low  . Alopecia 03/17/2007    Priority: Low    Medications-  reviewed and updated Current Outpatient Medications  Medication Sig Dispense Refill  . losartan-hydrochlorothiazide (HYZAAR) 100-12.5 MG tablet TAKE 1 TABLET DAILY. PLEASESCHEDULE AN APPOINTMENT    PRIOR TO FURTHER REFILLS. 90 tablet 1  . MINIVELLE 0.05 MG/24HR patch Place 1 patch onto the skin 2 (two) times a week.    . Multiple Vitamin (MULTIVITAMIN WITH MINERALS) TABS tablet Take 1 tablet by mouth daily.     No current facility-administered medications for this visit.      Objective:  Ht 5' (1.524 m)   Wt 166 lb 3.2 oz (75.4 kg)   LMP 12/13/2015 (Approximate)   BMI 32.46 kg/m  self reported vitals Gen: NAD, resting comfortably Lungs: nonlabored, normal respiratory rate  Skin: appears dry, no obvious rash    Assessment and Plan   #hypertension S: controlled on Losartan-HCT 100-12.5 mg daily. No missed doses, no side effects. Not checking BP at home. BP Readings from Last 3 Encounters:  04/11/18 128/82  02/18/17 108/76  12/14/16 (!) 142/78  A/P: hopefully stable- Morgan Barker is going to try to get a home cuff and we discussed goal <140/90 with more ideal #s <130/80  #hyperlipidemia S: Not currently taking any prescription medications for hyperlipidemia.  We reviewed ASCVD ten-year risk calculation of 6.3%. Recent Labs       Lab Results  Component Value Date   CHOL 220 (H) 04/11/2018   HDL 38.90 (L) 04/11/2018   LDLCALC 141 (H) 02/18/2017   LDLDIRECT 136.0 04/11/2018   TRIG 228.0 (H) 04/11/2018   CHOLHDL 6  04/11/2018     A/P: mild poor control but Patient below threshold for treatment of 7.5%-with that being said focusing on healthy eating and regular exercise as noted under obesity section are going to be key if Morgan Barker wants to remain off cholesterol medications in the long run   # Obesity  S:Patient had significant weight gain at time of last visit for physical.  Morgan Barker had a very rough work schedule at last encounter which strongly contributed. Still pretty demanding but  being able to work from home has helped. Feels like Morgan Barker snacks less working from home.   Morgan Barker has been getting out and walking with her mom. Cutting back on portion size and trying to do more chicken/veggies.  Wt Readings from Last 3 Encounters:  10/13/18 166 lb 3.2 oz (75.4 kg)  04/11/18 171 lb 9.6 oz (77.8 kg)  02/18/17 158 lb (71.7 kg)  A/P: improving control- Encouraged need for healthy eating, regular exercise, weight loss.    #Social update- mom has recovered well from second knee replacement.  Continues to manage church finances- but handling much better. Able to work from home.   #Lab review- last labs largely normal other than lipid panel-okay to wait until December physical- December 17 or later. Lab/Order associations:   ICD-10-CM   1. Essential hypertension  I10   2. Obesity (BMI 30-39.9)  E66.9   3. Hyperlipidemia, unspecified hyperlipidemia type  E78.5    Return precautions advised.  Garret Reddish, MD

## 2018-10-24 ENCOUNTER — Encounter: Payer: Self-pay | Admitting: Family Medicine

## 2018-10-30 DIAGNOSIS — Z01419 Encounter for gynecological examination (general) (routine) without abnormal findings: Secondary | ICD-10-CM | POA: Diagnosis not present

## 2018-10-30 DIAGNOSIS — Z6833 Body mass index (BMI) 33.0-33.9, adult: Secondary | ICD-10-CM | POA: Diagnosis not present

## 2018-10-30 DIAGNOSIS — Z1231 Encounter for screening mammogram for malignant neoplasm of breast: Secondary | ICD-10-CM | POA: Diagnosis not present

## 2018-11-01 DIAGNOSIS — Z01419 Encounter for gynecological examination (general) (routine) without abnormal findings: Secondary | ICD-10-CM | POA: Diagnosis not present

## 2018-11-01 LAB — HM PAP SMEAR: HM Pap smear: NEGATIVE

## 2018-11-01 LAB — RESULTS CONSOLE HPV: CHL HPV: NEGATIVE

## 2018-11-19 ENCOUNTER — Other Ambulatory Visit: Payer: Self-pay | Admitting: Family Medicine

## 2019-01-29 ENCOUNTER — Encounter: Payer: Self-pay | Admitting: Family Medicine

## 2019-04-16 ENCOUNTER — Other Ambulatory Visit: Payer: Self-pay

## 2019-04-17 ENCOUNTER — Encounter: Payer: Self-pay | Admitting: Family Medicine

## 2019-04-17 ENCOUNTER — Ambulatory Visit (INDEPENDENT_AMBULATORY_CARE_PROVIDER_SITE_OTHER): Payer: BC Managed Care – PPO | Admitting: Family Medicine

## 2019-04-17 VITALS — BP 112/80 | HR 80 | Temp 98.5°F | Ht 60.0 in | Wt 163.8 lb

## 2019-04-17 DIAGNOSIS — Z0000 Encounter for general adult medical examination without abnormal findings: Secondary | ICD-10-CM | POA: Diagnosis not present

## 2019-04-17 DIAGNOSIS — Z860100 Personal history of colon polyps, unspecified: Secondary | ICD-10-CM

## 2019-04-17 DIAGNOSIS — E785 Hyperlipidemia, unspecified: Secondary | ICD-10-CM | POA: Diagnosis not present

## 2019-04-17 DIAGNOSIS — I10 Essential (primary) hypertension: Secondary | ICD-10-CM

## 2019-04-17 LAB — COMPREHENSIVE METABOLIC PANEL
ALT: 22 U/L (ref 0–35)
AST: 18 U/L (ref 0–37)
Albumin: 4.6 g/dL (ref 3.5–5.2)
Alkaline Phosphatase: 43 U/L (ref 39–117)
BUN: 10 mg/dL (ref 6–23)
CO2: 26 mEq/L (ref 19–32)
Calcium: 10.1 mg/dL (ref 8.4–10.5)
Chloride: 101 mEq/L (ref 96–112)
Creatinine, Ser: 0.66 mg/dL (ref 0.40–1.20)
GFR: 113.87 mL/min (ref >60.00)
Glucose, Bld: 96 mg/dL (ref 70–99)
Potassium: 3.6 mEq/L (ref 3.5–5.1)
Sodium: 136 mEq/L (ref 135–145)
Total Bilirubin: 1.1 mg/dL (ref 0.2–1.2)
Total Protein: 7.6 g/dL (ref 6.0–8.3)

## 2019-04-17 LAB — POC URINALSYSI DIPSTICK (AUTOMATED)
Bilirubin, UA: NEGATIVE
Blood, UA: NEGATIVE
Glucose, UA: NEGATIVE
Ketones, UA: NEGATIVE
Leukocytes, UA: NEGATIVE
Nitrite, UA: NEGATIVE
Protein, UA: NEGATIVE
Spec Grav, UA: 1.01 (ref 1.010–1.025)
Urobilinogen, UA: 0.2 E.U./dL
pH, UA: 6 (ref 5.0–8.0)

## 2019-04-17 LAB — CBC WITH DIFFERENTIAL/PLATELET
Basophils Absolute: 0.1 10*3/uL (ref 0.0–0.1)
Basophils Relative: 0.9 % (ref 0.0–3.0)
Eosinophils Absolute: 0 10*3/uL (ref 0.0–0.7)
Eosinophils Relative: 0.4 % (ref 0.0–5.0)
HCT: 40.3 % (ref 36.0–46.0)
Hemoglobin: 13.2 g/dL (ref 12.0–15.0)
Lymphocytes Relative: 32.1 % (ref 12.0–46.0)
Lymphs Abs: 2.5 10*3/uL (ref 0.7–4.0)
MCHC: 32.8 g/dL (ref 30.0–36.0)
MCV: 85.1 fl (ref 78.0–100.0)
Monocytes Absolute: 0.5 10*3/uL (ref 0.1–1.0)
Monocytes Relative: 6.1 % (ref 3.0–12.0)
Neutro Abs: 4.8 10*3/uL (ref 1.4–7.7)
Neutrophils Relative %: 60.5 % (ref 43.0–77.0)
Platelets: 294 10*3/uL (ref 150.0–400.0)
RBC: 4.74 Mil/uL (ref 3.87–5.11)
RDW: 13.9 % (ref 11.5–15.5)
WBC: 7.9 10*3/uL (ref 4.0–10.5)

## 2019-04-17 LAB — LIPID PANEL
Cholesterol: 215 mg/dL — ABNORMAL HIGH (ref 0–200)
HDL: 38.5 mg/dL — ABNORMAL LOW (ref >39.00)
LDL Cholesterol: 141 mg/dL — ABNORMAL HIGH (ref 0–99)
NonHDL: 176.46
Total CHOL/HDL Ratio: 6
Triglycerides: 176 mg/dL — ABNORMAL HIGH (ref 0.0–149.0)
VLDL: 35.2 mg/dL (ref 0.0–40.0)

## 2019-04-17 MED ORDER — LOSARTAN POTASSIUM-HCTZ 100-12.5 MG PO TABS: ORAL_TABLET | ORAL | 3 refills | Status: DC

## 2019-04-17 NOTE — Addendum Note (Signed)
Addended by: Francis Dowse T on: 04/17/2019 10:33 AM   Modules accepted: Orders

## 2019-04-17 NOTE — Progress Notes (Signed)
Phone: [phone]   Subjective:  Patient presents today for their annual physical. Chief complaint-noted.   See problem oriented charting- Review of Systems  Constitutional: Negative.   HENT: Negative.   Eyes: Negative.   Respiratory: Negative.   Cardiovascular: Negative.   Gastrointestinal: Negative.   Genitourinary: Negative.   Musculoskeletal: Negative.   Skin: Negative.   Neurological: Negative.   Endo/Heme/Allergies: Negative.   Psychiatric/Behavioral: Negative.    The following were reviewed and entered/updated in epic: Past Medical History:  Diagnosis Date  . Cervical stenosis (uterine cervix)   . Endometrial polyp    w/ hx polyps (complex hyperplasia w/ focal atyical)  . History of colon polyps    hyperplastic 2014  . Hyperlipidemia   . Hypertension   . RBBB (right bundle branch block)   . Wears contact lenses    Patient Active Problem List   Diagnosis Date Noted  . Leukocytosis 02/16/2016    Priority: High  . Hyperlipidemia 10/15/2014    Priority: Medium  . HTN (hypertension) 07/02/2011    Priority: Medium  . S/P total hysterectomy 11/09/2016    Priority: Low  . History of colonic polyps 06/20/2014    Priority: Low  . History of abnormal cervical Pap smear 06/20/2014    Priority: Low  . Obesity (BMI 30-39.9) 07/06/2013    Priority: Low  . NECK AND BACK PAIN 12/11/2007    Priority: Low  . Alopecia 03/17/2007    Priority: Low   Past Surgical History:  Procedure Laterality Date  . CERVICAL BIOPSY  W/ LOOP ELECTRODE EXCISION    . COLONOSCOPY  last one 11-01-2012  . DILATATION & CURETTAGE/HYSTEROSCOPY WITH MYOSURE N/A 01/08/2016   Procedure: DILATATION & CURETTAGE/HYSTEROSCOPY WITH MYOSURE;  Surgeon: Arvella Nigh, MD;  Location: [place];  Service: Gynecology;  Laterality: N/A;  MYOSURE FOR POLYPS  . DILATATION & CURETTAGE/HYSTEROSCOPY WITH MYOSURE N/A 09/16/2016   Procedure: DILATATION & CURETTAGE/HYSTEROSCOPY WITH MYOSURE;   Surgeon: Arvella Nigh, MD;  Location: [place];  Service: Gynecology;  Laterality: N/A;  . LAPAROSCOPIC CHOLECYSTECTOMY  10/08/2005  . LAPAROSCOPIC VAGINAL HYSTERECTOMY WITH SALPINGECTOMY Bilateral 11/09/2016   Procedure: LAPAROSCOPIC ASSISTED VAGINAL HYSTERECTOMY WITH SALPINGECTOMY,  oophorectomy, cystoscopy;  Surgeon: Arvella Nigh, MD;  Location: [facility];  Service: Gynecology;  Laterality: Bilateral;    Family History  Problem Relation Age of Onset  . Hypertension Mother   . Colon cancer Father        diagnosed 75    Medications- reviewed and updated Current Outpatient Medications  Medication Sig Dispense Refill  . losartan-hydrochlorothiazide (HYZAAR) 100-12.5 MG tablet TAKE 1 TABLET DAILY. 90 tablet 3  . MINIVELLE 0.05 MG/24HR patch Place 1 patch onto the skin 2 (two) times a week.    . Multiple Vitamin (MULTIVITAMIN WITH MINERALS) TABS tablet Take 1 tablet by mouth daily.     No current facility-administered medications for this visit.    Allergies-reviewed and updated No Known Allergies  Social History   Social History Narrative   Single. No children. Mother lives with patient.       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 TM normal. Bridge of nose normal. Eyelids normal.  Neck: no thyromegaly or cervical lymphadenopathy  CV: RRR no murmurs rubs or gallops Lungs: CTAB no crackles, wheeze, rhonchi Abdomen: soft/nontender/nondistended/normal bowel sounds. No rebound or guarding.  Ext: no edema Skin: warm, dry Neuro: grossly normal, moves all extremities, PERRLA   Assessment and Plan   53 y.o. female  presenting for annual physical.  Health Maintenance counseling: 1. Anticipatory guidance: Patient counseled regarding regular dental exams-q6 months, eye exams ( had yesterday) ,  avoiding smoking and second hand smoke , limiting alcohol to 1 beverage per day . Patient does not drink at all.    2. Risk factor reduction:  Advised patient of need for regular exercise and diet rich and fruits and vegetables to reduce risk of heart attack and stroke. Exercise- due to working more hours had not been exercising. She is working from home so she is able to move more while working. In the office she does have a standing desk.  Diet-tries to eat a healthy diet. She does need to work on portion control. Feels like not snacking as much working from home.  Congratulated patient on 8 pound weight loss over last year!  Wt Readings from Last 3 Encounters:  04/17/19 163 lb 12.8 oz (74.3 kg)  10/13/18 166 lb 3.2 oz (75.4 kg)  04/11/18 171 lb 9.6 oz (77.8 kg)  3. Immunizations/screenings/ancillary studies-up-to-date and she is a interested in COVID-19 vaccination when available  Immunization History  Administered Date(s) Administered  . Influenza-Unspecified 01/24/2013, 01/24/2014, 02/18/2016, 02/02/2017, 01/26/2018, 01/28/2019, 01/28/2019  . Td 02/24/2006  . Tdap 02/16/2016  . Zoster Recombinat (Shingrix) 04/11/2018, 06/13/2018   4. Cervical cancer screening- Followed by GYN up to date on PAP.  Dr. Radene Knee. Last pap smear actually mentioned no further paps- we will look for more info next year.  5. Breast cancer screening-  breast exam monthly  and mammogram yearly up to date notes in chart.  Last mammogram we have on file is May 2019- she actually had one this year so we will get records  6. Colon cancer screening - up to date next due 12/23/2022 with 5-year repeat from December 22, 2017 with normal colonoscopy but family history 7. Skin cancer screening- has been seen in the last few years for cyst on forehead  removed. advised regular sunscreen use. Denies worrisome, changing, or new skin lesions.  8. Birth control/STD check- No currently sexually active.  9. Osteoporosis screening at 60- Has had one several years ago at GYN.  -never smoker  Status of chronic or acute concerns   She is hoping to work from home permanently- otherwise may be back to part time in April  Hyperlipidemia, unspecified hyperlipidemia type-not currently on treatment.  Will update ASCVD risk  Essential hypertension-compliant with losartan hydrochlorothiazide 100-12.5 mg.  Refilled for 1 year today  Recommended follow up: Return in about 6 months (around 10/16/2019) for follow up- or sooner if needed.  Lab/Order associations: fasting   ICD-10-CM   1. Preventative health care  Z00.00 CBC with Differential/Platelet    Comprehensive metabolic panel    Lipid panel    POCT Urinalysis Dipstick (Automated)  2. Hyperlipidemia, unspecified hyperlipidemia type  E78.5 CBC with Differential/Platelet    Comprehensive metabolic panel    Lipid panel    POCT Urinalysis Dipstick (Automated)  3. Essential hypertension  I10 POCT Urinalysis Dipstick (Automated)  4. History of colonic polyps  Z86.010     Meds ordered this encounter  Medications  . losartan-hydrochlorothiazide (HYZAAR) 100-12.5 MG tablet    Sig: TAKE 1 TABLET DAILY.    Dispense:  90 tablet    Refill:  3    Return precautions advised.  Garret Reddish, MD

## 2019-04-17 NOTE — Patient Instructions (Addendum)
Please stop by lab before you go If you do not have mychart- we will call you about results within 5 business days of [place] receiving them.  If you have mychart- we will send your results within 3 business days of [place] receiving them.  If abnormal or we want to clarify a result, we will call or mychart you to make sure you receive the message.  If you have questions or concerns or don't hear within 5-7 days, please send [place] a message or call us.   Sign release of information at the check out desk for mammogram from 2020  congrats on being 8 lbs down despite the covid pandemic! You are one of the few, the proud! Keep up the good work  Recommended follow up: Return in about 6 months (around 10/16/2019) for follow up- or sooner if needed.

## 2019-10-22 ENCOUNTER — Encounter: Payer: Self-pay | Admitting: Family Medicine

## 2019-10-22 ENCOUNTER — Ambulatory Visit: Payer: No Typology Code available for payment source | Admitting: Family Medicine

## 2019-10-22 ENCOUNTER — Other Ambulatory Visit: Payer: Self-pay

## 2019-10-22 VITALS — BP 100/70 | HR 76 | Temp 97.7°F | Ht 60.0 in | Wt 163.4 lb

## 2019-10-22 DIAGNOSIS — Z1159 Encounter for screening for other viral diseases: Secondary | ICD-10-CM | POA: Diagnosis not present

## 2019-10-22 DIAGNOSIS — E785 Hyperlipidemia, unspecified: Secondary | ICD-10-CM

## 2019-10-22 DIAGNOSIS — E669 Obesity, unspecified: Secondary | ICD-10-CM

## 2019-10-22 DIAGNOSIS — I10 Essential (primary) hypertension: Secondary | ICD-10-CM | POA: Diagnosis not present

## 2019-10-22 MED ORDER — LOSARTAN POTASSIUM 100 MG PO TABS: 100.0000 mg | ORAL_TABLET | Freq: Every day | ORAL | 3 refills | Status: DC

## 2019-10-22 NOTE — Progress Notes (Signed)
Phone [phone] In person visit   Subjective:   Morgan Barker is a 54 y.o. year old very pleasant female patient who presents for/with See problem oriented charting Chief Complaint  Patient presents with  . Follow-up  . Hyperlipidemia  . Hypertension   This visit occurred during the SARS-CoV-2 public health emergency.  Safety protocols were in place, including screening questions prior to the visit, additional usage of staff PPE, and extensive cleaning of exam room while observing appropriate contact time as indicated for disinfecting solutions.   Past Medical History-  Patient Active Problem List   Diagnosis Date Noted  . Leukocytosis 02/16/2016    Priority: High  . Hyperlipidemia 10/15/2014    Priority: Medium  . HTN (hypertension) 07/02/2011    Priority: Medium  . S/P total hysterectomy 11/09/2016    Priority: Low  . History of colonic polyps 06/20/2014    Priority: Low  . History of abnormal cervical Pap smear 06/20/2014    Priority: Low  . Obesity (BMI 30-39.9) 07/06/2013    Priority: Low  . NECK AND BACK PAIN 12/11/2007    Priority: Low  . Alopecia 03/17/2007    Priority: Low    Medications- reviewed and updated Current Outpatient Medications  Medication Sig Dispense Refill  . MINIVELLE 0.05 MG/24HR patch Place 1 patch onto the skin 2 (two) times a week.    . Multiple Vitamin (MULTIVITAMIN WITH MINERALS) TABS tablet Take 1 tablet by mouth daily.    Marland Kitchen losartan (COZAAR) 100 MG tablet Take 1 tablet (100 mg total) by mouth daily. 90 tablet 3   No current facility-administered medications for this visit.     Objective:  BP 100/70   Pulse 76   Temp 97.7 F (36.5 C)   Ht 5' (1.524 m)   Wt 163 lb 6.4 oz (74.1 kg)   LMP 12/13/2015 (Approximate)   SpO2 98%   BMI 31.91 kg/m  Gen: NAD, resting comfortably CV: RRR no murmurs rubs or gallops Lungs: CTAB no crackles, wheeze, rhonchi Abdomen: soft/nontender/nondistended/normal bowel sounds.  Ext: no edema Skin:  warm, dry     Assessment and Plan   #hypertension S: medication: Hyzarr 100-12.5Mg  Home readings #s: 100-120/70-80.  BP Readings from Last 3 Encounters:  10/22/19 100/70  04/17/19 112/80  04/11/18 128/82  A/P: Blood pressure is very well controlled in office today.  In fact systolic is rather low and has been the last 2 visits-we opted to stop the hydrochlorothiazide portion of her medication and will continue losartan 100 mg alone for now.  She does great job monitoring blood pressure at home and I would like to keep her less than 135/85 on average.  # Obesity  S: trying to walk some- 15-20 mins working on 30 about 4 days a week. Trying to cut down on snacking- had actually thought she gained weight but has been more stable thanks to more veggies in diet- salads and less meat  Wt Readings from Last 3 Encounters:  10/22/19 163 lb 6.4 oz (74.1 kg)  04/17/19 163 lb 12.8 oz (74.3 kg)  10/13/18 166 lb 3.2 oz (75.4 kg)  A/P: stable weight. Encouraged need for healthy eating, regular exercise, weight loss.   #hyperlipidemia S: Medication:none  Lab Results  Component Value Date   CHOL 215 (H) 04/17/2019   HDL 38.50 (L) 04/17/2019   LDLCALC 141 (H) 04/17/2019   LDLDIRECT 136.0 04/11/2018   TRIG 176.0 (H) 04/17/2019   CHOLHDL 6 04/17/2019   A/P: 10-year ASCVD risk of  only 2.6%-we discussed working on healthy eating/regular exercise/weight loss to try to remain off cholesterol medicine long-term if possible.  Recommended follow up: Return in about 6 months (around 04/22/2020).  Lab/Order associations:   ICD-10-CM   1. Essential hypertension  I10 CBC with Differential/Platelet    Comprehensive metabolic panel    Lipid panel  2. Hyperlipidemia, unspecified hyperlipidemia type  E78.5 CBC with Differential/Platelet    Comprehensive metabolic panel    Lipid panel  3. Encounter for hepatitis C screening test for low risk patient  Z11.59 Hepatitis C Antibody    Meds ordered this  encounter  Medications  . losartan (COZAAR) 100 MG tablet    Sig: Take 1 tablet (100 mg total) by mouth daily.    Dispense:  90 tablet    Refill:  3   Return precautions advised.  Garret Reddish, MD

## 2019-10-22 NOTE — Patient Instructions (Addendum)
Blood pressure is very well controlled in office today.  In fact systolic is rather low and has been the last 2 visits-we opted to stop the hydrochlorothiazide portion of her medication and will continue losartan 100 mg alone for now.  She does great job monitoring blood pressure at home and I would like to keep her less than 135/85 on average.  I have ordered blood work for your December physical-you can schedule labs a few days beforehand if you would like at the desk.  Please also schedule your physical at that time  Last physical was 04/16/2020 so if we have something in late December can schedule

## 2020-03-06 LAB — HM PAP SMEAR: HM Pap smear: NEGATIVE

## 2020-03-06 LAB — HM MAMMOGRAPHY

## 2020-04-14 ENCOUNTER — Other Ambulatory Visit: Payer: No Typology Code available for payment source

## 2020-04-14 ENCOUNTER — Other Ambulatory Visit: Payer: Self-pay

## 2020-04-14 DIAGNOSIS — E785 Hyperlipidemia, unspecified: Secondary | ICD-10-CM

## 2020-04-14 DIAGNOSIS — I10 Essential (primary) hypertension: Secondary | ICD-10-CM

## 2020-04-14 DIAGNOSIS — Z1159 Encounter for screening for other viral diseases: Secondary | ICD-10-CM

## 2020-04-15 ENCOUNTER — Other Ambulatory Visit: Payer: No Typology Code available for payment source

## 2020-04-16 LAB — CBC WITH DIFFERENTIAL/PLATELET
Absolute Monocytes: 351 cells/uL (ref 200–950)
Basophils Absolute: 47 cells/uL (ref 0–200)
Basophils Relative: 0.6 %
Eosinophils Absolute: 39 cells/uL (ref 15–500)
Eosinophils Relative: 0.5 %
HCT: 42 % (ref 35.0–45.0)
Hemoglobin: 13.1 g/dL (ref 11.7–15.5)
Lymphs Abs: 2824 cells/uL (ref 850–3900)
MCH: 27.6 pg (ref 27.0–33.0)
MCHC: 31.2 g/dL — ABNORMAL LOW (ref 32.0–36.0)
MCV: 88.6 fL (ref 80.0–100.0)
MPV: 11.3 fL (ref 7.5–12.5)
Monocytes Relative: 4.5 %
Neutro Abs: 4540 cells/uL (ref 1500–7800)
Neutrophils Relative %: 58.2 %
Platelets: 285 10*3/uL (ref 140–400)
RBC: 4.74 10*6/uL (ref 3.80–5.10)
RDW: 14.2 % (ref 11.0–15.0)
Total Lymphocyte: 36.2 %
WBC: 7.8 10*3/uL (ref 3.8–10.8)

## 2020-04-16 LAB — COMPREHENSIVE METABOLIC PANEL
AG Ratio: 1.4 (calc) (ref 1.0–2.5)
ALT: 20 U/L (ref 6–29)
AST: 18 U/L (ref 10–35)
Albumin: 4.4 g/dL (ref 3.6–5.1)
Alkaline phosphatase (APISO): 43 U/L (ref 37–153)
BUN: 10 mg/dL (ref 7–25)
CO2: 24 mmol/L (ref 20–32)
Calcium: 9.9 mg/dL (ref 8.6–10.4)
Chloride: 107 mmol/L (ref 98–110)
Creat: 0.69 mg/dL (ref 0.50–1.05)
Globulin: 3.1 g/dL (calc) (ref 1.9–3.7)
Glucose, Bld: 93 mg/dL (ref 65–99)
Potassium: 4.3 mmol/L (ref 3.5–5.3)
Sodium: 141 mmol/L (ref 135–146)
Total Bilirubin: 1 mg/dL (ref 0.2–1.2)
Total Protein: 7.5 g/dL (ref 6.1–8.1)

## 2020-04-16 LAB — LIPID PANEL
Cholesterol: 211 mg/dL — ABNORMAL HIGH (ref <200)
HDL: 45 mg/dL — ABNORMAL LOW (ref >=50)
LDL Cholesterol (Calc): 142 mg/dL (calc) — ABNORMAL HIGH
Non-HDL Cholesterol (Calc): 166 mg/dL (calc) — ABNORMAL HIGH (ref <130)
Total CHOL/HDL Ratio: 4.7 (calc) (ref <5.0)
Triglycerides: 122 mg/dL (ref <150)

## 2020-04-16 LAB — HEPATITIS C ANTIBODY
Hepatitis C Ab: NONREACTIVE
SIGNAL TO CUT-OFF: 0 (ref <1.00)

## 2020-04-16 LAB — TEST AUTHORIZATION

## 2020-04-16 LAB — EXTRA LAV TOP TUBE

## 2020-04-16 NOTE — Progress Notes (Signed)
Phone [phone]   Subjective:  Patient presents today for their annual physical. Chief complaint-noted.   See problem oriented charting- Review of Systems  Constitutional: Negative for chills and fever.  HENT: Positive for nosebleeds (a few noseblleds in Nov when heat first came on- nose was dry). Negative for hearing loss.   Eyes: Negative for blurred vision and double vision.  Respiratory: Negative for cough, shortness of breath and wheezing.   Cardiovascular: Negative for chest pain and palpitations.  Gastrointestinal: Negative for heartburn, nausea and vomiting.  Genitourinary: Negative for dysuria and frequency.  Musculoskeletal: Negative for myalgias and neck pain.  Skin: Negative for itching and rash.  Neurological: Negative for dizziness and headaches.  Endo/Heme/Allergies: Negative for polydipsia. Does not bruise/bleed easily.  Psychiatric/Behavioral: Negative for depression and suicidal ideas.   The following were reviewed and entered/updated in epic: Past Medical History:  Diagnosis Date  . Cervical stenosis (uterine cervix)   . Endometrial polyp    w/ hx polyps (complex hyperplasia w/ focal atyical)  . History of colon polyps    hyperplastic 2014  . Hyperlipidemia   . Hypertension   . RBBB (right bundle branch block)   . Wears contact lenses    Patient Active Problem List   Diagnosis Date Noted  . Leukocytosis 02/16/2016    Priority: High  . Hyperlipidemia 10/15/2014    Priority: Medium  . HTN (hypertension) 07/02/2011    Priority: Medium  . S/P total hysterectomy 11/09/2016    Priority: Low  . History of colonic polyps 06/20/2014    Priority: Low  . History of abnormal cervical Pap smear 06/20/2014    Priority: Low  . Obesity (BMI 30-39.9) 07/06/2013    Priority: Low  . NECK AND BACK PAIN 12/11/2007    Priority: Low  . Alopecia 03/17/2007    Priority: Low   Past Surgical History:  Procedure Laterality Date  . CERVICAL BIOPSY  W/ LOOP ELECTRODE  EXCISION    . COLONOSCOPY  last one 11-01-2012  . DILATATION & CURETTAGE/HYSTEROSCOPY WITH MYOSURE N/A 01/08/2016   Procedure: DILATATION & CURETTAGE/HYSTEROSCOPY WITH MYOSURE;  Surgeon: Richardean Chimera, MD;  Location: [facility];  Service: Gynecology;  Laterality: N/A;  MYOSURE FOR POLYPS  . DILATATION & CURETTAGE/HYSTEROSCOPY WITH MYOSURE N/A 09/16/2016   Procedure: DILATATION & CURETTAGE/HYSTEROSCOPY WITH MYOSURE;  Surgeon: Richardean Chimera, MD;  Location: [facility] [place];  Service: Gynecology;  Laterality: N/A;  . LAPAROSCOPIC CHOLECYSTECTOMY  10/08/2005  . LAPAROSCOPIC VAGINAL HYSTERECTOMY WITH SALPINGECTOMY Bilateral 11/09/2016   Procedure: LAPAROSCOPIC ASSISTED VAGINAL HYSTERECTOMY WITH SALPINGECTOMY,  oophorectomy, cystoscopy;  Surgeon: Richardean Chimera, MD;  Location: [facility];  Service: Gynecology;  Laterality: Bilateral;    Family History  Problem Relation Age of Onset  . Hypertension Mother   . Colon cancer Father        diagnosed 65    Medications- reviewed and updated Current Outpatient Medications  Medication Sig Dispense Refill  . losartan (COZAAR) 100 MG tablet Take 1 tablet (100 mg total) by mouth daily. 90 tablet 3  . MINIVELLE 0.05 MG/24HR patch Place 1 patch onto the skin 2 (two) times a week.    . Multiple Vitamin (MULTIVITAMIN WITH MINERALS) TABS tablet Take 1 tablet by mouth daily.    . cholecalciferol (VITAMIN D3) 25 MCG (1000 UNIT) tablet Take 1,000 Units by mouth daily.     No current facility-administered medications for this visit.    Allergies-reviewed and updated No Known Allergies  Social History  Social History Narrative   Single. No children. Mother lives with patient.       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 Oropharynx normal Neck: no thyromegaly CV: RRR no murmurs rubs or gallops Lungs: CTAB no crackles, wheeze, rhonchi Abdomen: soft/nontender/nondistended/normal bowel sounds. No rebound or guarding.  Ext: no edema Skin: warm, dry Neuro: grossly normal, moves all extremities, PERRLA   Assessment and Plan   54 y.o. female presenting for annual physical.  Health Maintenance counseling: 1. Anticipatory guidance: Patient counseled regarding regular dental exams -q6 months, eye exams - yearly just had on tuesday,  avoiding smoking and second hand smoke , limiting alcohol to 1 beverage per day- does not drink .   2. Risk factor reduction:  Advised patient of need for regular exercise and diet rich and fruits and vegetables to reduce risk of heart attack and stroke. Exercise- feels she could improve on this- but working 12-16 hour days hoping just short term- so practically impossible at present. Diet-busy last 3-4 months and intake has been down- has cut down on snacking, cut down on sweets. Down 4 lbs from last years physical Wt Readings from Last 3 Encounters:  04/17/20 159 lb 9.6 oz (72.4 kg)  10/22/19 163 lb 6.4 oz (74.1 kg)  04/17/19 163 lb 12.8 oz (74.3 kg)  3. Immunizations/screenings/ancillary studies- scheduling booster soon, otherwise up to date.  Immunization History  Administered Date(s) Administered  . Influenza-Unspecified 01/24/2013, 01/24/2014, 02/18/2016, 02/02/2017, 01/26/2018, 01/28/2019, 01/28/2019, 02/03/2020  . Moderna Sars-Covid-2 Vaccination 08/05/2019, 09/02/2019  . Td 02/24/2006  . Tdap 02/16/2016  . Zoster Recombinat (Shingrix) 04/11/2018, 06/13/2018  4. Cervical cancer screening- had pelvic exam with GYN- they said no further paps due to hysterectomy due to abnormal bleeding but no cancer found. Removed cervic 5. Breast cancer screening-   breast exam  With GYN and mammogram  Mar 06, 2020- will sign for records with gynecology 6. Colon cancer screening - 12/22/17 with 5 year repeat planned 7. Skin cancer screening- lower risk due to melanin content- and saw a derm a few years ago. advised regular sunscreen use. Denies worrisome, changing, or new skin lesions.  8. Birth control/STD check- opts out as not active. minivelle patch for hot flashes 9. Osteoporosis screening at 11- had with GYN and reports normal- will get records -never smoker  Status of chronic or acute concerns   #hyperlipidemia S: Medication:none  Lab Results  Component Value Date   CHOL 211 (H) 04/14/2020   HDL 45 (L) 04/14/2020   LDLCALC 142 (H) 04/14/2020   LDLDIRECT 136.0 04/11/2018   TRIG 122 04/14/2020   CHOLHDL 4.7 04/14/2020   A/P: 10 year ascvd risk only 3.9% and working on weight loss- appropriate goal- no meds for now  #hypertension S: medication: Losartan 100Mg  Checks bp 3-4 x a week usually <120/80 BP Readings from Last 3 Encounters:  04/17/20 118/74  10/22/19 100/70  04/17/19 112/80  A/P: Stable. Continue current medications.   Occasional epigastric pain that radiates to her back. Having several days a week. Not with exertion. Most  common midday. No SOB, left arm or neck pain. Getting more regular.  -could certainly be reflux. Advised trial prilosec OTC 20mg  daily before breakfast and see if this makes a difference. If no improvement within the month or worsens id like her to follow up- would likely at least get EKG for baseline. If other new symptoms asked her to let me know  Recommended follow up: Return in about 1 year (around 04/17/2021) for physical or sooner if needed. as long as home blood pressure <130/80 on average.  Lab/Order associations:   ICD-10-CM   1. Preventative health care  Z00.00   2. Primary hypertension  I10   3. Hyperlipidemia, unspecified hyperlipidemia type  E78.5     No orders of the defined types were placed  in this encounter.  Return precautions advised.  Garret Reddish, MD

## 2020-04-16 NOTE — Patient Instructions (Addendum)
Health Maintenance Due  Topic Date Due  . MAMMOGRAM Was completed November 11,2021. Sign release to get exam results at check out for mammogram and bone density  Also see if they can send [place] bone density 09/23/2019  . COVID-19 Vaccine (3 - Booster for Moderna series) Will schedule to have this done next week.  03/04/2020   Stop by the desk and tell them we did your physical today- they should refund your copay  -could certainly be reflux. Advised trial prilosec OTC 20mg  daily before breakfast and see if this makes a difference. If no improvement within the month or worsens id like her to follow up- would likely at least get EKG for baseline. If other new symptoms asked her to let me know

## 2020-04-17 ENCOUNTER — Encounter: Payer: Self-pay | Admitting: Family Medicine

## 2020-04-17 ENCOUNTER — Ambulatory Visit (INDEPENDENT_AMBULATORY_CARE_PROVIDER_SITE_OTHER): Payer: No Typology Code available for payment source | Admitting: Family Medicine

## 2020-04-17 ENCOUNTER — Other Ambulatory Visit: Payer: Self-pay

## 2020-04-17 VITALS — BP 118/74 | HR 74 | Temp 98.1°F | Resp 18 | Ht 60.0 in | Wt 159.6 lb

## 2020-04-17 DIAGNOSIS — Z0000 Encounter for general adult medical examination without abnormal findings: Secondary | ICD-10-CM | POA: Diagnosis not present

## 2020-04-17 DIAGNOSIS — E785 Hyperlipidemia, unspecified: Secondary | ICD-10-CM | POA: Diagnosis not present

## 2020-04-17 DIAGNOSIS — I10 Essential (primary) hypertension: Secondary | ICD-10-CM | POA: Diagnosis not present

## 2020-04-22 ENCOUNTER — Ambulatory Visit: Payer: No Typology Code available for payment source | Admitting: Family Medicine

## 2020-04-27 ENCOUNTER — Encounter: Payer: Self-pay | Admitting: Family Medicine

## 2020-07-10 ENCOUNTER — Encounter: Payer: Self-pay | Admitting: Family Medicine

## 2020-10-21 ENCOUNTER — Other Ambulatory Visit: Payer: Self-pay | Admitting: Family Medicine

## 2020-11-04 ENCOUNTER — Encounter: Payer: Self-pay | Admitting: Family Medicine

## 2021-03-03 ENCOUNTER — Encounter: Payer: Self-pay | Admitting: Family Medicine

## 2021-03-31 ENCOUNTER — Other Ambulatory Visit: Payer: Self-pay

## 2021-03-31 ENCOUNTER — Ambulatory Visit
Admission: RE | Admit: 2021-03-31 | Discharge: 2021-03-31 | Disposition: A | Discharge status: Home / Self Care | Payer: No Typology Code available for payment source | Source: Ambulatory Visit | Attending: Physician Assistant | Admitting: Physician Assistant

## 2021-03-31 VITALS — BP 156/98 | HR 97 | Temp 99.0°F | Resp 18

## 2021-03-31 DIAGNOSIS — Z20822 Contact with and (suspected) exposure to covid-19: Secondary | ICD-10-CM | POA: Diagnosis not present

## 2021-03-31 NOTE — ED Triage Notes (Signed)
Scratchy throat and congestion last Monday.  Cough and nasal congestion.

## 2021-03-31 NOTE — Discharge Instructions (Addendum)
Covid and Influenza are pending

## 2021-03-31 NOTE — ED Provider Notes (Signed)
[facility]    CSN: [id number] Arrival date & time: 03/31/21  0946      History   Chief Complaint No chief complaint on file.   HPI Morgan Barker is a 55 y.o. female.   The history is provided by the patient. No language interpreter was used.  Cough Cough characteristics:  Non-productive Sputum characteristics:  Nondescript Severity:  Moderate Onset quality:  Gradual Duration:  1 week Timing:  Constant Progression:  Waxing and waning Context: sick contacts   Relieved by:  Nothing Worsened by:  Nothing Ineffective treatments:  None tried  Past Medical History:  Diagnosis Date   Cervical stenosis (uterine cervix)    Endometrial polyp    w/ hx polyps (complex hyperplasia w/ focal atyical)   History of colon polyps    hyperplastic 2014   Hyperlipidemia    Hypertension    RBBB (right bundle branch block)    Wears contact lenses     Patient Active Problem List   Diagnosis Date Noted   S/P total hysterectomy 11/09/2016   Leukocytosis 02/16/2016   Hyperlipidemia 10/15/2014   History of colonic polyps 06/20/2014   History of abnormal cervical Pap smear 06/20/2014   Obesity (BMI 30-39.9) 07/06/2013   HTN (hypertension) 07/02/2011   NECK AND BACK PAIN 12/11/2007   Alopecia 03/17/2007    Past Surgical History:  Procedure Laterality Date   CERVICAL BIOPSY  W/ LOOP ELECTRODE EXCISION     COLONOSCOPY  last one 11-01-2012   DILATATION & CURETTAGE/HYSTEROSCOPY WITH MYOSURE N/A 01/08/2016   Procedure: DILATATION & CURETTAGE/HYSTEROSCOPY WITH MYOSURE;  Surgeon: Arvella Nigh, MD;  Location: [place];  Service: Gynecology;  Laterality: N/A;  MYOSURE FOR POLYPS   DILATATION & CURETTAGE/HYSTEROSCOPY WITH MYOSURE N/A 09/16/2016   Procedure: DILATATION & CURETTAGE/HYSTEROSCOPY WITH MYOSURE;  Surgeon: Arvella Nigh, MD;  Location: [place];  Service: Gynecology;  Laterality: N/A;   LAPAROSCOPIC CHOLECYSTECTOMY  10/08/2005    LAPAROSCOPIC VAGINAL HYSTERECTOMY WITH SALPINGECTOMY Bilateral 11/09/2016   Procedure: LAPAROSCOPIC ASSISTED VAGINAL HYSTERECTOMY WITH SALPINGECTOMY,  oophorectomy, cystoscopy;  Surgeon: Arvella Nigh, MD;  Location: [facility];  Service: Gynecology;  Laterality: Bilateral;    OB History     Gravida  0   Para  0   Term  0   Preterm  0   AB  0   Living  0      SAB  0   IAB  0   Ectopic  0   Multiple  0   Live Births  0            Home Medications    Prior to Admission medications   Medication Sig Start Date End Date Taking? Authorizing Provider  cholecalciferol (VITAMIN D3) 25 MCG (1000 UNIT) tablet Take 1,000 Units by mouth daily.    [provider]  losartan (COZAAR) 100 MG tablet TAKE 1 TABLET BY MOUTH EVERY DAY 10/21/20   Marin Olp, MD  MINIVELLE 0.05 MG/24HR patch Place 1 patch onto the skin 2 (two) times a week. 12/16/16   [provider]  Multiple Vitamin (MULTIVITAMIN WITH MINERALS) TABS tablet Take 1 tablet by mouth daily.    [provider]  hydrochlorothiazide (HYDRODIURIL) 25 MG tablet Take 1 tablet (25 mg total) by mouth daily. 05/11/11 07/02/11  Kennyth Arnold, FNP    Family History Family History  Problem Relation Age of Onset   Hypertension Mother    Colon cancer Father  diagnosed 60    Social History Social History   Tobacco Use   Smoking status: Never   Smokeless tobacco: Never  Vaping Use   Vaping Use: Never used  Substance Use Topics   Alcohol use: No    Alcohol/week: 0.0 standard drinks   Drug use: No     Allergies   Patient has no known allergies.   Review of Systems Review of Systems  Respiratory:  Positive for cough.   All other systems reviewed and are negative.   Physical Exam Triage Vital Signs ED Triage Vitals  Enc Vitals Group     BP 03/31/21 1001 (!) 156/98     Pulse Rate 03/31/21 1001 97     Resp 03/31/21 1001 18     Temp 03/31/21 1001 99 F (37.2 C)      Temp Source 03/31/21 1001 Oral     SpO2 03/31/21 1001 97 %     Weight --      Height --      Head Circumference --      Peak Flow --      Pain Score 03/31/21 1002 0     Pain Loc --      Pain Edu? --      Excl. in [place]? --    No data found.  Updated Vital Signs BP (!) 156/98 (BP Location: Right Arm)   Pulse 97   Temp 99 F (37.2 C) (Oral)   Resp 18   LMP 12/13/2015 (Approximate)   SpO2 97%   Visual Acuity Right Eye Distance:   Left Eye Distance:   Bilateral Distance:    Right Eye Near:   Left Eye Near:    Bilateral Near:     Physical Exam Vitals reviewed.  HENT:     Nose: Nose normal.     Mouth/Throat:     Mouth: Mucous membranes are moist.  Eyes:     Pupils: Pupils are equal, round, and reactive to light.  Cardiovascular:     Rate and Rhythm: Normal rate.  Pulmonary:     Effort: Pulmonary effort is normal.  Musculoskeletal:        General: Normal range of motion.  Skin:    General: Skin is warm.  Neurological:     General: No focal deficit present.     Mental Status: She is alert.  Psychiatric:        Mood and Affect: Mood normal.     UC Treatments / Results  Labs (all labs ordered are listed, but only abnormal results are displayed) Labs Reviewed  COVID-19, FLU A+B NAA    EKG   Radiology No results found.  Procedures Procedures (including critical care time)  Medications Ordered in UC Medications - No data to display  Initial Impression / Assessment and Plan / UC Course  I have reviewed the triage vital signs and the nursing notes.  Pertinent labs & imaging results that were available during my care of the patient were reviewed by me and considered in my medical decision making (see chart for details).     Covid and Influenza pending  Final Clinical Impressions(s) / UC Diagnoses   Final diagnoses:  Exposure to COVID-19 virus     Discharge Instructions      Covid and Influenza are pending   ED Prescriptions   None     PDMP not reviewed this encounter. An After Visit Summary was printed and given to the patient.    Morgan Ada  K, PA-C 03/31/21 1040

## 2021-04-01 LAB — COVID-19, FLU A+B NAA
Influenza A, NAA: NOT DETECTED
Influenza B, NAA: NOT DETECTED
SARS-CoV-2, NAA: NOT DETECTED

## 2021-04-06 ENCOUNTER — Ambulatory Visit: Payer: No Typology Code available for payment source | Admitting: Family Medicine

## 2021-04-06 ENCOUNTER — Other Ambulatory Visit: Payer: Self-pay

## 2021-04-06 VITALS — BP 142/70 | HR 89 | Temp 98.2°F | Wt 158.6 lb

## 2021-04-06 DIAGNOSIS — J069 Acute upper respiratory infection, unspecified: Secondary | ICD-10-CM | POA: Diagnosis not present

## 2021-04-06 DIAGNOSIS — I10 Essential (primary) hypertension: Secondary | ICD-10-CM

## 2021-04-06 NOTE — Patient Instructions (Signed)
It was very nice to see you today!  If worse, not improving, let me know   PLEASE NOTE:  If you had any lab tests please let us know if you have not heard back within a few days. You may see your results on MyChart before we have a chance to review them but we will give you a call once they are reviewed by [place]. If we ordered any referrals today, please let us know if you have not heard from their office within the next week.   Please try these tips to maintain a healthy lifestyle:  Eat most of your calories during the day when you are active. Eliminate processed foods including packaged sweets (pies, cakes, cookies), reduce intake of potatoes, white bread, white pasta, and white rice. Look for whole grain options, oat flour or almond flour.  Each meal should contain half fruits/vegetables, one quarter protein, and one quarter carbs (no bigger than a computer mouse).  Cut down on sweet beverages. This includes juice, soda, and sweet tea. Also watch fruit intake, though this is a healthier sweet option, it still contains natural sugar! Limit to 3 servings daily.  Drink at least 1 glass of water with each meal and aim for at least 8 glasses per day  Exercise at least 150 minutes every week.

## 2021-04-06 NOTE — Progress Notes (Signed)
Subjective:     Patient ID: Morgan Barker, female    DOB: 02-01-1966, 55 y.o.   MRN: [id number]  Chief Complaint  Patient presents with   Cough   Headache    HPI Congestion for 2 wks, runny nose.  Has been to UC 12/6 and flu/covid neg. Mild HA.  Min cough. No facial pain. No sob. No f/c. No v/d now.  Corecetin-poss caused GI upset/diarrhea.  Not taking now and better.   Intermitt mid back pain.   HTN-bp's have been up some.  Prior to illness-110-120/70-80  There are no preventive care reminders to display for this patient.  Past Medical History:  Diagnosis Date   Cervical stenosis (uterine cervix)    Endometrial polyp    w/ hx polyps (complex hyperplasia w/ focal atyical)   History of colon polyps    hyperplastic 2014   Hyperlipidemia    Hypertension    RBBB (right bundle branch block)    Wears contact lenses     Past Surgical History:  Procedure Laterality Date   CERVICAL BIOPSY  W/ LOOP ELECTRODE EXCISION     COLONOSCOPY  last one 11-01-2012   DILATATION & CURETTAGE/HYSTEROSCOPY WITH MYOSURE N/A 01/08/2016   Procedure: DILATATION & CURETTAGE/HYSTEROSCOPY WITH MYOSURE;  Surgeon: Arvella Nigh, MD;  Location: [place];  Service: Gynecology;  Laterality: N/A;  MYOSURE FOR POLYPS   DILATATION & CURETTAGE/HYSTEROSCOPY WITH MYOSURE N/A 09/16/2016   Procedure: DILATATION & CURETTAGE/HYSTEROSCOPY WITH MYOSURE;  Surgeon: Arvella Nigh, MD;  Location: [place];  Service: Gynecology;  Laterality: N/A;   LAPAROSCOPIC CHOLECYSTECTOMY  10/08/2005   LAPAROSCOPIC VAGINAL HYSTERECTOMY WITH SALPINGECTOMY Bilateral 11/09/2016   Procedure: LAPAROSCOPIC ASSISTED VAGINAL HYSTERECTOMY WITH SALPINGECTOMY,  oophorectomy, cystoscopy;  Surgeon: Arvella Nigh, MD;  Location: [facility];  Service: Gynecology;  Laterality: Bilateral;    Outpatient Medications Prior to Visit  Medication Sig Dispense Refill   cholecalciferol (VITAMIN D3) 25 MCG (1000  UNIT) tablet Take 1,000 Units by mouth daily.     losartan (COZAAR) 100 MG tablet TAKE 1 TABLET BY MOUTH EVERY DAY 90 tablet 3   MINIVELLE 0.05 MG/24HR patch Place 1 patch onto the skin 2 (two) times a week.     Multiple Vitamin (MULTIVITAMIN WITH MINERALS) TABS tablet Take 1 tablet by mouth daily.     No facility-administered medications prior to visit.    No Known Allergies AQT:[id number]/[id number] except as noted in HPI      Objective:    Gen: WDWN NAD HEENT: NCAT, conjunctiva not injected, sclera nonicteric.TM WNL B, OP moist, no exudates . Sinuses NT NECK:  supple, no thyromegaly, mild submand nodes b, no carotid bruits CARDIAC: RRR, S1S2+, no murmur. DP 2+B LUNGS: CTAB. No wheezes ABDOMEN:  BS+, soft, NTND, No HSM, no masses EXT:  no edema MSK: no gross abnormalities.  NEURO: A&O x3.  CN II-XII intact.  PSYCH: normal mood. Good eye contact   BP (!) 142/70   Pulse 89   Temp 98.2 F (36.8 C)   Wt 158 lb 9.6 oz (71.9 kg)   LMP 12/13/2015 (Approximate)   SpO2 98%   BMI 30.97 kg/m  Wt Readings from Last 3 Encounters:  04/06/21 158 lb 9.6 oz (71.9 kg)  04/17/20 159 lb 9.6 oz (72.4 kg)  10/22/19 163 lb 6.4 oz (74.1 kg)       Assessment & Plan:   Problem List Items Addressed This Visit   None Visit Diagnoses  Upper respiratory infection, acute    -  Primary     URI-prob still viral.  Reassurance.  Monitor.  Worse, no change few days, will do abx.  2.  HTN-bp sl up today-had been good.  May be from URI.  Monitor closely.   No orders of the defined types were placed in this encounter.   Wellington Hampshire., MD

## 2021-05-08 NOTE — Progress Notes (Addendum)
Phone [phone]   Subjective:  Patient presents today for their annual physical. Chief complaint-noted.   See problem oriented charting- Review of Systems  Constitutional:  Negative for chills and fever.  HENT:  Positive for congestion (uses flonase). Negative for ear discharge, sinus pain and sore throat.   Eyes:  Negative for blurred vision and double vision.  Respiratory:  Negative for cough and shortness of breath.   Cardiovascular:  Positive for chest pain (short twinges at night occasionally before bed- not exertional). Negative for palpitations.  Gastrointestinal:  Positive for abdominal pain (at times but mainly in her back). Negative for constipation, diarrhea, heartburn, nausea and vomiting.  Genitourinary:  Negative for dysuria and urgency.  Musculoskeletal:  Positive for back pain and myalgias. Negative for neck pain.  Skin:  Negative for itching and rash.  Neurological:  Positive for headaches (occasoinal sinus headaches). Negative for dizziness.  Endo/Heme/Allergies:  Negative for polydipsia. Does not bruise/bleed easily.  Psychiatric/Behavioral:  Negative for depression and suicidal ideas.    The following were reviewed and entered/updated in epic: Past Medical History:  Diagnosis Date   Cervical stenosis (uterine cervix)    Endometrial polyp    w/ hx polyps (complex hyperplasia w/ focal atyical)   History of colon polyps    hyperplastic 2014   Hyperlipidemia    Hypertension    RBBB (right bundle branch block)    Wears contact lenses    Patient Active Problem List   Diagnosis Date Noted   Leukocytosis 02/16/2016    Priority: High   Hyperlipidemia 10/15/2014    Priority: Medium    HTN (hypertension) 07/02/2011    Priority: Medium    S/P total hysterectomy 11/09/2016    Priority: Low   History of colonic polyps 06/20/2014    Priority: Low   History of abnormal cervical Pap smear 06/20/2014    Priority: Low   Obesity (BMI 30-39.9) 07/06/2013     Priority: Low   NECK AND BACK PAIN 12/11/2007    Priority: Low   Alopecia 03/17/2007    Priority: Low   Past Surgical History:  Procedure Laterality Date   CERVICAL BIOPSY  W/ LOOP ELECTRODE EXCISION     COLONOSCOPY  last one 11-01-2012   DILATATION & CURETTAGE/HYSTEROSCOPY WITH MYOSURE N/A 01/08/2016   Procedure: DILATATION & CURETTAGE/HYSTEROSCOPY WITH MYOSURE;  Surgeon: Arvella Nigh, MD;  Location: [facility];  Service: Gynecology;  Laterality: N/A;  MYOSURE FOR POLYPS   DILATATION & CURETTAGE/HYSTEROSCOPY WITH MYOSURE N/A 09/16/2016   Procedure: DILATATION & CURETTAGE/HYSTEROSCOPY WITH MYOSURE;  Surgeon: Arvella Nigh, MD;  Location: [facility];  Service: Gynecology;  Laterality: N/A;   LAPAROSCOPIC CHOLECYSTECTOMY  10/08/2005   LAPAROSCOPIC VAGINAL HYSTERECTOMY WITH SALPINGECTOMY Bilateral 11/09/2016   Procedure: LAPAROSCOPIC ASSISTED VAGINAL HYSTERECTOMY WITH SALPINGECTOMY,  oophorectomy, cystoscopy;  Surgeon: Arvella Nigh, MD;  Location: [facility];  Service: Gynecology;  Laterality: Bilateral;    Family History  Problem Relation Age of Onset   Hypertension Mother    Colon cancer Father        diagnosed 1    Medications- reviewed and updated Current Outpatient Medications  Medication Sig Dispense Refill   cholecalciferol (VITAMIN D3) 25 MCG (1000 UNIT) tablet Take 1,000 Units by mouth daily.     losartan (COZAAR) 100 MG tablet TAKE 1 TABLET BY MOUTH EVERY DAY 90 tablet 3   MINIVELLE 0.05 MG/24HR patch Place 1 patch onto the skin 2 (two) times a week.  Multiple Vitamin (MULTIVITAMIN WITH MINERALS) TABS tablet Take 1 tablet by mouth daily.     No current facility-administered medications for this visit.    Allergies-reviewed and updated No Known Allergies  Social History   Social History Narrative   Single. No children. Mother lives with patient.       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 Oropharynx normal Neck: no thyromegaly CV: RRR no murmurs rubs or gallops Lungs: CTAB no crackles, wheeze, rhonchi Abdomen: soft/nontender/nondistended/normal bowel sounds. No rebound or guarding.  Ext: no edema Skin: warm, dry Back - Normal skin, Spine with normal alignment and no deformity.  No tenderness to vertebral process palpation.  Paraspinous muscles are not tender and without spasm.   Range of motion is full at neck and lumbar sacral regions. Negative Straight leg raise.  Neuro-  5/5 strength lower extremities    Assessment and Plan   56 y.o. female presenting for annual physical.  Health Maintenance counseling: 1. Anticipatory guidance: Patient counseled regarding regular dental exams -q6 months, eye exams - yearly- monitoring pressure q3 months and has history glaucoma,  avoiding smoking and second hand smoke , limiting alcohol to 1 beverage per day-  Patient does not drink at all.  No illicit drugs .   2. Risk factor reduction:  Advised patient of need for regular exercise and diet rich and fruits and vegetables to reduce risk of heart attack and stroke. Exercise-  work is still very demanding and unable to find time for exercise- still 12-16 hours.  Diet/Diet Management-tries to eat a healthy diet.  down 1 lb from last CPE. 8 lbs down the year prior . More salads/fruits Wt Readings from Last 3 Encounters:  05/18/21 156 lb 4 oz (70.9 kg)  04/06/21 158 lb 9.6 oz (71.9 kg)  04/17/20 159 lb 9.6 oz (72.4 kg)  3. Immunizations/screenings/ancillary studies- up to date Immunization History  Administered Date(s) Administered   Influenza-Unspecified 01/24/2013, 01/24/2014, 02/18/2016,  02/02/2017, 01/26/2018, 01/28/2019, 01/28/2019, 02/03/2020   Moderna Covid-19 Vaccine Bivalent Booster 79yrs & up 02/15/2021   Moderna Sars-Covid-2 Vaccination 08/05/2019, 09/02/2019, 04/22/2020   Td 02/24/2006   Tdap 02/16/2016   Zoster Recombinat (Shingrix) 04/11/2018, 06/13/2018   4. Cervical cancer screening- Followed by GYN up to date on PAP.  Dr. Radene Knee. Last pap smear actually mentioned no further paps- due to total hysterectomy 5. Breast cancer screening-  breast exam monthly at home and gets exam with Dr. Radene Knee yearly and mammogram  03/06/20 with 1 year repeat planned- she has had this and we need records 6. Colon cancer screening -colonoscopy 12/22/17 with 5 year repeat planned  7. Skin cancer screening-  lower risk due to melanin content. advised regular sunscreen use. Denies worrisome, changing, or new skin lesions.  8. Birth control/STD check- minivelle patch for hot flashes. Not dating- declines std screening 9. Osteoporosis screening at 65-Has had one several years ago at GYN= reports normal -Never smoker   Status of chronic or acute concerns   #hyperlipidemia S: Medication:none  Lab Results  Component Value  Date   CHOL 217 (H) 05/13/2021   HDL 43.90 05/13/2021   LDLCALC 155 (H) 05/13/2021   LDLDIRECT 136.0 04/11/2018   TRIG 91.0 05/13/2021   CHOLHDL 5 05/13/2021   A/P: 10 year risk of heart attack or stroke at 5%. No known CAD in first degree relatives and doesn't sound like 2nd degree either- MGM with heart failure but not CAD  #hypertension S: medication: Losartan 100Mg  daily. Was higher when feeling ill in December.  BP Readings from Last 3 Encounters:  05/18/21 122/76  04/06/21 (!) 142/70  03/31/21 (!) 156/98  A/P: Controlled. Continue current medications.    # Back pain S:mid back pain and discomfort under left side of rib cage for over a year- off and on. Good days and bad days. Good weeks and bad weeks. Had an illness after thanksgiving and more bothersome  (covid negative). Pain at worst 5/10. Pain 2/10 at moment. Sometimes center of back -sometimes moves to either side. Still at times wraps around to the epigastric area- seems to focus more on the back this year than in epigastric area like last year. Prilosec did not help- we thought may be reflux.    - has not seen anyone for this.  - slight twinges of pain in chest occasionally- very very mild. No shortness of breath left arm or neck pain.  A/P: duration of symptoms now over a year and settling more in the back than epigastric area - will refer to sports medicine for their expert opinion but if they do not think it Is msk in origin would want to see her back to discuss next steps  -mild twinges of chest pain at night not exertional- would recommend retrying omeprazole before dinner and see if that resolves issue- if it does not or symptoms worsen please return to see [place]. Could be stress related. Does not sound cardiac  Recommended follow up: Return in about 1 year (around 05/18/2022) for physical or sooner if needed. Particularly if back not improving  Lab/Order associations: fasting   ICD-10-CM   1. Preventative health care  Z00.00     2. Primary hypertension  I10     3. Hyperlipidemia, unspecified hyperlipidemia type  E78.5     4. Chronic bilateral thoracic back pain  M54.6 Ambulatory referral to Sports Medicine   G89.29       No orders of the defined types were placed in this encounter.  I,Jada Bradford,acting as a scribe for Garret Reddish, MD.,have documented all relevant documentation on the behalf of Garret Reddish, MD,as directed by  Garret Reddish, MD while in the presence of Garret Reddish, MD.  I, Garret Reddish, MD, have reviewed all documentation for this visit. The documentation on 05/18/21 for the exam, diagnosis, procedures, and orders are all accurate and complete.  Return precautions advised.  Garret Reddish, MD

## 2021-05-11 NOTE — Patient Instructions (Addendum)
Sign release of information at the check out desk for last mammogram from 2022 (we have the one from 2021)  We will call you within two weeks about your referral to sports medicine. If you do not hear within 2 weeks, give [place] a call.   -mild twinges of chest pain at night not exertional- would recommend retrying omeprazole before dinner and see if that resolves issue- if it does not or symptoms worsen please return to see [place]  Recommended follow up: Return in about 1 year (around 05/18/2022) for physical or sooner if needed.

## 2021-05-13 ENCOUNTER — Other Ambulatory Visit: Payer: Self-pay

## 2021-05-13 ENCOUNTER — Other Ambulatory Visit (INDEPENDENT_AMBULATORY_CARE_PROVIDER_SITE_OTHER): Payer: No Typology Code available for payment source

## 2021-05-13 DIAGNOSIS — Z0000 Encounter for general adult medical examination without abnormal findings: Secondary | ICD-10-CM | POA: Diagnosis not present

## 2021-05-13 DIAGNOSIS — E785 Hyperlipidemia, unspecified: Secondary | ICD-10-CM | POA: Diagnosis not present

## 2021-05-13 DIAGNOSIS — I10 Essential (primary) hypertension: Secondary | ICD-10-CM

## 2021-05-13 LAB — CBC WITH DIFFERENTIAL/PLATELET
Basophils Absolute: 0 10*3/uL (ref 0.0–0.1)
Basophils Relative: 0.4 % (ref 0.0–3.0)
Eosinophils Absolute: 0 10*3/uL (ref 0.0–0.7)
Eosinophils Relative: 0.6 % (ref 0.0–5.0)
HCT: 40.3 % (ref 36.0–46.0)
Hemoglobin: 13.1 g/dL (ref 12.0–15.0)
Lymphocytes Relative: 33.7 % (ref 12.0–46.0)
Lymphs Abs: 2.4 10*3/uL (ref 0.7–4.0)
MCHC: 32.5 g/dL (ref 30.0–36.0)
MCV: 83.6 fl (ref 78.0–100.0)
Monocytes Absolute: 0.4 10*3/uL (ref 0.1–1.0)
Monocytes Relative: 4.9 % (ref 3.0–12.0)
Neutro Abs: 4.3 10*3/uL (ref 1.4–7.7)
Neutrophils Relative %: 60.4 % (ref 43.0–77.0)
Platelets: 278 10*3/uL (ref 150.0–400.0)
RBC: 4.82 Mil/uL (ref 3.87–5.11)
RDW: 14.2 % (ref 11.5–15.5)
WBC: 7.2 10*3/uL (ref 4.0–10.5)

## 2021-05-13 LAB — LIPID PANEL
Cholesterol: 217 mg/dL — ABNORMAL HIGH (ref 0–200)
HDL: 43.9 mg/dL (ref >39.00)
LDL Cholesterol: 155 mg/dL — ABNORMAL HIGH (ref 0–99)
NonHDL: 172.83
Total CHOL/HDL Ratio: 5
Triglycerides: 91 mg/dL (ref 0.0–149.0)
VLDL: 18.2 mg/dL (ref 0.0–40.0)

## 2021-05-13 LAB — COMPREHENSIVE METABOLIC PANEL
ALT: 23 U/L (ref 0–35)
AST: 19 U/L (ref 0–37)
Albumin: 4.6 g/dL (ref 3.5–5.2)
Alkaline Phosphatase: 46 U/L (ref 39–117)
BUN: 15 mg/dL (ref 6–23)
CO2: 26 mEq/L (ref 19–32)
Calcium: 10.5 mg/dL (ref 8.4–10.5)
Chloride: 103 mEq/L (ref 96–112)
Creatinine, Ser: 0.74 mg/dL (ref 0.40–1.20)
GFR: 92.06 mL/min (ref >60.00)
Glucose, Bld: 93 mg/dL (ref 70–99)
Potassium: 4 mEq/L (ref 3.5–5.1)
Sodium: 138 mEq/L (ref 135–145)
Total Bilirubin: 1.1 mg/dL (ref 0.2–1.2)
Total Protein: 8.2 g/dL (ref 6.0–8.3)

## 2021-05-14 ENCOUNTER — Other Ambulatory Visit: Payer: Self-pay

## 2021-05-14 DIAGNOSIS — Z0000 Encounter for general adult medical examination without abnormal findings: Secondary | ICD-10-CM

## 2021-05-18 ENCOUNTER — Encounter: Payer: Self-pay | Admitting: Family Medicine

## 2021-05-18 ENCOUNTER — Ambulatory Visit (INDEPENDENT_AMBULATORY_CARE_PROVIDER_SITE_OTHER): Payer: No Typology Code available for payment source | Admitting: Family Medicine

## 2021-05-18 ENCOUNTER — Other Ambulatory Visit: Payer: Self-pay

## 2021-05-18 VITALS — BP 122/76 | HR 84 | Temp 98.0°F | Ht 60.0 in | Wt 156.2 lb

## 2021-05-18 DIAGNOSIS — I10 Essential (primary) hypertension: Secondary | ICD-10-CM | POA: Diagnosis not present

## 2021-05-18 DIAGNOSIS — E785 Hyperlipidemia, unspecified: Secondary | ICD-10-CM | POA: Diagnosis not present

## 2021-05-18 DIAGNOSIS — M546 Pain in thoracic spine: Secondary | ICD-10-CM

## 2021-05-18 DIAGNOSIS — G8929 Other chronic pain: Secondary | ICD-10-CM

## 2021-05-18 DIAGNOSIS — Z0000 Encounter for general adult medical examination without abnormal findings: Secondary | ICD-10-CM | POA: Diagnosis not present

## 2021-05-20 NOTE — Progress Notes (Signed)
Benito Mccreedy D.[place] Phone: [phone]   Assessment and Plan:     1. Chronic bilateral thoracic back pain 2. Somatic dysfunction of thoracic region 3. Somatic dysfunction of lumbar region 4. Somatic dysfunction of pelvic region 5. Somatic dysfunction of rib region -Chronic with exacerbation, initial sports medicine visit - Patient presents with multiple musculoskeletal complaints with most prominent being in thoracic spine - No MOI or red flag symptoms, so no imaging taken at today's visit - Patient elected to try OMT today.  Tolerated well per note below. - Decision today to treat with OMT was based on Physical Exam   After verbal consent patient was treated with HVLA (high velocity low amplitude), ME (muscle energy), FPR (flex positional release), ST (soft tissue), PC/PD (Pelvic Compression/ Pelvic Decompression) techniques in rib, thoracic, lumbar, and pelvic areas. Patient tolerated the procedure well with improvement in symptoms.  Patient educated on potential side effects of soreness and recommended to rest, hydrate, and use Tylenol as needed for pain control.    6. Abdominal pain, chronic, epigastric -Chronic, intermittent, unchanged, initial visit - Intermittent abdominal pain that will radiate to back of unknown etiology - Unlikely related to somatic dysfunction of thoracic and lumbar spine - Patient plans on restarting Prilosec to see if symptoms are related to acid reflux.  She has history of cholecystectomy.  Denies shortness of breath, chest pain - Recommend continued work-up with PCP if symptoms do not resolve   Pertinent previous records reviewed include PCP note 05/18/2021   Follow Up: 3 to 4 weeks for reevaluation.  Would repeat OMT if patient found benefit.  If no improvement or worsening of symptoms, would obtain x-ray thoracic spine, lumbar spine   Subjective:   I, Morgan Barker, am serving as  a Education administrator for Doctor Glennon Mac  Chief Complaint: back pain requesting OMT  HPI:  05/21/2021  Patient is a 56 year old female complaining of back pain . Patient states she has pain in the middle of her back that moves from side to side with low back pain when she sits for awhile she has pain under her hip and thigh been going on for about a year or so sometimes it does radiate, has not been taking ib or tylenol no heat or ice , no MOI   Relevant Historical Information: History cholecystectomy, hysterectomy.  Hypertension  Additional pertinent review of systems negative.  Current Outpatient Medications  Medication Sig Dispense Refill   cholecalciferol (VITAMIN D3) 25 MCG (1000 UNIT) tablet Take 1,000 Units by mouth daily.     losartan (COZAAR) 100 MG tablet TAKE 1 TABLET BY MOUTH EVERY DAY 90 tablet 3   MINIVELLE 0.05 MG/24HR patch Place 1 patch onto the skin 2 (two) times a week.     Multiple Vitamin (MULTIVITAMIN WITH MINERALS) TABS tablet Take 1 tablet by mouth daily.     No current facility-administered medications for this visit.      Objective:     Vitals:   05/21/21 0820  BP: 122/62  Pulse: 82  SpO2: 99%  Weight: 157 lb (71.2 kg)  Height: 5' (1.524 m)      Body mass index is 30.66 kg/m.    Physical Exam:      Gen: Appears well, nad, nontoxic and pleasant Psych: Alert and oriented, appropriate mood and affect Neuro: sensation intact, strength is 5/5 in upper and lower extremities, muscle tone wnl Skin: no susupicious lesions  or rashes  Back - Normal skin, Spine with normal alignment and no deformity.   No tenderness to vertebral process palpation.   Lumbar and thoracic paraspinous muscles are mildly tender and without spasm Straight leg raise negative   OMT Physical Exam:  ASIS Compression Test: Positive Right   Rib: Right elevated first rib with TTP Thoracic: TTP paraspinal, T4-8 RLSR Lumbar: TTP paraspinal, L2 RLSL Pelvis: Right anterior  innominate  Electronically signed by:  Benito Mccreedy D.[facility] 8:46 AM 05/21/21

## 2021-05-21 ENCOUNTER — Ambulatory Visit: Payer: No Typology Code available for payment source | Admitting: Sports Medicine

## 2021-05-21 ENCOUNTER — Other Ambulatory Visit: Payer: Self-pay

## 2021-05-21 VITALS — BP 122/62 | HR 82 | Ht 60.0 in | Wt 157.0 lb

## 2021-05-21 DIAGNOSIS — M9902 Segmental and somatic dysfunction of thoracic region: Secondary | ICD-10-CM

## 2021-05-21 DIAGNOSIS — M9903 Segmental and somatic dysfunction of lumbar region: Secondary | ICD-10-CM | POA: Diagnosis not present

## 2021-05-21 DIAGNOSIS — M546 Pain in thoracic spine: Secondary | ICD-10-CM

## 2021-05-21 DIAGNOSIS — R1013 Epigastric pain: Secondary | ICD-10-CM

## 2021-05-21 DIAGNOSIS — M9905 Segmental and somatic dysfunction of pelvic region: Secondary | ICD-10-CM | POA: Diagnosis not present

## 2021-05-21 DIAGNOSIS — G8929 Other chronic pain: Secondary | ICD-10-CM | POA: Diagnosis not present

## 2021-05-21 DIAGNOSIS — M9908 Segmental and somatic dysfunction of rib cage: Secondary | ICD-10-CM

## 2021-05-21 NOTE — Patient Instructions (Addendum)
Good to see you  Tylenol,  NSAIDS as needed  3-4 week follow up for repeat OMT

## 2021-06-04 NOTE — Progress Notes (Signed)
°   Morgan Barker D.[place] Phone: [phone]   Assessment and Plan:     1. Chronic bilateral thoracic back pain 2. Somatic dysfunction of thoracic region 3. Somatic dysfunction of lumbar region 4. Somatic dysfunction of pelvic region -Chronic with exacerbation, subsequent visit - Recurrence of multiple musculoskeletal complaints with most prominent being in bilateral mid and lower back, although overall improved since prior office visit with resolution of rib pain - Patient has received significant relief with OMT in the past.  Elects for repeat OMT today.  Tolerated well per note below. - Decision today to treat with OMT was based on Physical Exam   After verbal consent patient was treated with HVLA (high velocity low amplitude), ME (muscle energy), FPR (flex positional release), ST (soft tissue), PC/PD (Pelvic Compression/ Pelvic Decompression) techniques in  thoracic, lumbar, and pelvic areas. Patient tolerated the procedure well with improvement in symptoms.  Patient educated on potential side effects of soreness and recommended to rest, hydrate, and use Tylenol as needed for pain control.   Pertinent previous records reviewed include none   Follow Up: 4 weeks for repeat maintenance OMT   Subjective:   I, Morgan Barker, am serving as a Education administrator for Doctor Glennon Mac  Chief Complaint: OMT follow up   HPI:  05/21/2021  Patient is a 56 year old female complaining of back pain . Patient states she has pain in the middle of her back that moves from side to side with low back pain when she sits for awhile she has pain under her hip and thigh been going on for about a year or so sometimes it does radiate, has not been taking ib or tylenol no heat or ice , no MOI   06/11/2021 Patient states that she's doing pretty good      Relevant Historical Information: History cholecystectomy, hysterectomy.   Hypertension  Additional pertinent review of systems negative.  Current Outpatient Medications  Medication Sig Dispense Refill   cholecalciferol (VITAMIN D3) 25 MCG (1000 UNIT) tablet Take 1,000 Units by mouth daily.     losartan (COZAAR) 100 MG tablet TAKE 1 TABLET BY MOUTH EVERY DAY 90 tablet 3   MINIVELLE 0.05 MG/24HR patch Place 1 patch onto the skin 2 (two) times a week.     Multiple Vitamin (MULTIVITAMIN WITH MINERALS) TABS tablet Take 1 tablet by mouth daily.     No current facility-administered medications for this visit.      Objective:     Vitals:   06/11/21 0804  BP: 132/80  Pulse: 76  SpO2: 99%  Weight: 161 lb (73 kg)  Height: 5' (1.524 m)      Body mass index is 31.44 kg/m.    Physical Exam:     General: Well-appearing, cooperative, sitting comfortably in no acute distress.   OMT Physical Exam:  ASIS Compression Test: Positive Right   Thoracic: TTP paraspinal, T4-7 RRSL  Lumbar: TTP paraspinal, L2 RRSR Pelvis: Right anterior innominate with out flare  Electronically signed by:  Morgan Barker D.[facility] 8:21 AM 06/11/21

## 2021-06-11 ENCOUNTER — Ambulatory Visit: Payer: No Typology Code available for payment source | Admitting: Sports Medicine

## 2021-06-11 ENCOUNTER — Other Ambulatory Visit: Payer: Self-pay

## 2021-06-11 VITALS — BP 132/80 | HR 76 | Ht 60.0 in | Wt 161.0 lb

## 2021-06-11 DIAGNOSIS — G8929 Other chronic pain: Secondary | ICD-10-CM | POA: Diagnosis not present

## 2021-06-11 DIAGNOSIS — M9905 Segmental and somatic dysfunction of pelvic region: Secondary | ICD-10-CM

## 2021-06-11 DIAGNOSIS — M546 Pain in thoracic spine: Secondary | ICD-10-CM | POA: Diagnosis not present

## 2021-06-11 DIAGNOSIS — M9902 Segmental and somatic dysfunction of thoracic region: Secondary | ICD-10-CM

## 2021-06-11 DIAGNOSIS — M9903 Segmental and somatic dysfunction of lumbar region: Secondary | ICD-10-CM | POA: Diagnosis not present

## 2021-06-11 NOTE — Patient Instructions (Addendum)
Good to see you  4 week follow up   

## 2021-07-08 NOTE — Progress Notes (Signed)
?   [name] ?[facility] ?[place] ?Phone: [phone] ?  ?Assessment and Plan:   ?  ?1. Chronic bilateral thoracic back pain ?2. Somatic dysfunction of thoracic region ?3. Somatic dysfunction of lumbar region ?4. Somatic dysfunction of pelvic region ?-Chronic with exacerbation, subsequent visit ?- Recurrence of multiple musculoskeletal complaints with most prominent being in left lower back and right middle back.  Overall improved with regular OMT treatments ?- Patient has received significant relief with OMT in the past.  Elects for repeat OMT today.  Tolerated well per note below. ?- Decision today to treat with OMT was based on Physical Exam ?  ?After verbal consent patient was treated with HVLA (high velocity low amplitude), ME (muscle energy), FPR (flex positional release), ST (soft tissue), PC/PD (Pelvic Compression/ Pelvic Decompression) techniques in cervical, rib, thoracic, lumbar, and pelvic areas. Patient tolerated the procedure well with improvement in symptoms.  Patient educated on potential side effects of soreness and recommended to rest, hydrate, and use Tylenol as needed for pain control. ?  ?Pertinent previous records reviewed include none ?  ?Follow Up: 4 weeks for repeat OMT maintenance ?  ?Subjective:   ?I, [name], am serving as a Education administrator for Doctor [employer] ? ?Chief Complaint: OMT follow up  ? ?HPI:  ?05/21/2021  ?Patient is a 55 year old female complaining of back pain . Patient states she has pain in the middle of her back that moves from side to side with low back pain when she sits for awhile she has pain under her hip and thigh been going on for about a year or so sometimes it does radiate, has not been taking ib or tylenol no heat or ice , no MOI  ?  ?06/11/2021 ?Patient states that she's doing pretty good  ?  ?07/09/2021 ?Patient states that her low back and left side thoracic back  has been hurting this morning   ? ? ?  ?Relevant Historical Information: History cholecystectomy, hysterectomy.  Hypertension ? ?Additional pertinent review of systems negative. ? ?Current Outpatient Medications  ?Medication Sig Dispense Refill  ? cholecalciferol (VITAMIN D3) 25 MCG (1000 UNIT) tablet Take 1,000 Units by mouth daily.    ? losartan (COZAAR) 100 MG tablet TAKE 1 TABLET BY MOUTH EVERY DAY 90 tablet 3  ? MINIVELLE 0.05 MG/24HR patch Place 1 patch onto the skin 2 (two) times a week.    ? Multiple Vitamin (MULTIVITAMIN WITH MINERALS) TABS tablet Take 1 tablet by mouth daily.    ? ?No current facility-administered medications for this visit.  ?  ?  ?Objective:   ?  ?Vitals:  ? 07/09/21 0758  ?BP: 132/78  ?Pulse: 75  ?SpO2: 99%  ?Weight: 156 lb (70.8 kg)  ?Height: 5' (1.524 m)  ?  ?  ?Body mass index is 30.47 kg/m?.  ?  ?Physical Exam:   ?  ?General: Well-appearing, cooperative, sitting comfortably in no acute distress.  ? ?OMT Physical Exam: ? ?ASIS Compression Test: Positive left ?  ?Thoracic: TTP paraspinal, T6-9 RRSL ?Lumbar: TTP paraspinal, L1-3 RLSR ?Pelvis: Left anterior innominate ? ?Electronically signed by:  ?[name] ?[facility] ?8:14 AM 07/09/21 ?

## 2021-07-09 ENCOUNTER — Ambulatory Visit: Payer: No Typology Code available for payment source | Admitting: Sports Medicine

## 2021-07-09 ENCOUNTER — Other Ambulatory Visit: Payer: Self-pay

## 2021-07-09 VITALS — BP 132/78 | HR 75 | Ht 60.0 in | Wt 156.0 lb

## 2021-07-09 DIAGNOSIS — G8929 Other chronic pain: Secondary | ICD-10-CM

## 2021-07-09 DIAGNOSIS — M9905 Segmental and somatic dysfunction of pelvic region: Secondary | ICD-10-CM

## 2021-07-09 DIAGNOSIS — M9903 Segmental and somatic dysfunction of lumbar region: Secondary | ICD-10-CM

## 2021-07-09 DIAGNOSIS — M546 Pain in thoracic spine: Secondary | ICD-10-CM | POA: Diagnosis not present

## 2021-07-09 DIAGNOSIS — M9902 Segmental and somatic dysfunction of thoracic region: Secondary | ICD-10-CM | POA: Diagnosis not present

## 2021-07-09 NOTE — Patient Instructions (Addendum)
Good to see you  ?4 week omt follow up  ?

## 2021-08-05 NOTE — Progress Notes (Signed)
? Morgan Barker D.Morgan Barker ?[facility] ?[place] ?Phone: [phone] ?  ?Assessment and Plan:   ?  ?1. Chronic bilateral thoracic back pain ?2. Chronic left-sided low back pain with left-sided sciatica ?3. Somatic dysfunction of lumbar region ?4. Somatic dysfunction of pelvic region ?5. Somatic dysfunction of sacral region ?-Chronic with exacerbation, subsequent visit ?- Recurrence of multiple musculoskeletal complaints with most prominent being left lower back with left-sided sciatica symptoms flaring since last office visit ?- Start meloxicam 15 mg daily x2 weeks.  If still having pain after 2 weeks, complete 3rd-week of meloxicam. May use remaining meloxicam as needed once daily for pain control.  Do not to use additional NSAIDs while taking meloxicam.  May use Tylenol [phone] mg 2 to 3 times a day for breakthrough pain. ?- Start HEP for sciatica ?- Patient has received significant relief with OMT in the past.  Elects for repeat OMT today.  Tolerated well per note below. ?- Decision today to treat with OMT was based on Physical Exam ?  ?After verbal consent patient was treated with HVLA (high velocity low amplitude), ME (muscle energy), FPR (flex positional release), ST (soft tissue), PC/PD (Pelvic Compression/ Pelvic Decompression) techniques in sacrum, lumbar, and pelvic areas. Patient tolerated the procedure well with improvement in symptoms.  Patient educated on potential side effects of soreness and recommended to rest, hydrate, and use Tylenol as needed for pain control. ?  ?Pertinent previous records reviewed include none ?  ?Follow Up: 3 weeks for reevaluation of sciatica.  Could repeat OMT at that time.  Could obtain lumbar x-ray if no improving or worsening of symptoms ?  ?Subjective:   ?I, [name], am serving as a Education administrator for [employer] ? ?Chief Complaint: OMT follow up  ?  ?HPI:  ?05/21/2021  ?Patient is a 56 year old female complaining of  back pain . Patient states she has pain in the middle of her back that moves from side to side with low back pain when she sits for awhile she has pain under her hip and thigh been going on for about a year or so sometimes it does radiate, has not been taking ib or tylenol no heat or ice , no MOI  ?  ?06/11/2021 ?Patient states that she's doing pretty good  ?  ?07/09/2021 ?Patient states that her low back and left side thoracic back  has been hurting this morning  ?  ?08/06/2021 ?Patient states that her back has been bothering her since the last visit mid , low back and sciatic pain that goes down to the foot , left side sciatic goes down to her foot , right side goes just to her thigh, and low back pain all across the back  ?  ?Relevant Historical Information: History cholecystectomy, hysterectomy.  Hypertension ? ?Additional pertinent review of systems negative. ? ?Current Outpatient Medications  ?Medication Sig Dispense Refill  ? cholecalciferol (VITAMIN D3) 25 MCG (1000 UNIT) tablet Take 1,000 Units by mouth daily.    ? losartan (COZAAR) 100 MG tablet TAKE 1 TABLET BY MOUTH EVERY DAY 90 tablet 3  ? meloxicam (MOBIC) 15 MG tablet Take 1 tablet (15 mg total) by mouth daily. 30 tablet 0  ? MINIVELLE 0.05 MG/24HR patch Place 1 patch onto the skin 2 (two) times a week.    ? Multiple Vitamin (MULTIVITAMIN WITH MINERALS) TABS tablet Take 1 tablet by mouth daily.    ? ?No current facility-administered medications  for this visit.  ?  ?  ?Objective:   ?  ?Vitals:  ? 08/06/21 0752  ?BP: 124/80  ?Pulse: 79  ?SpO2: 98%  ?Weight: 162 lb (73.5 kg)  ?Height: 5' (1.524 m)  ?  ?  ?Body mass index is 31.64 kg/m?.  ?  ?Physical Exam:   ?  ?General: Well-appearing, cooperative, sitting comfortably in no acute distress.  ? ?OMT Physical Exam: ? ?ASIS Compression Test: Positive left ?Sacrum: Positive sphinx, TTP sacral base bilaterally, though worse on left ?Lumbar: TTP paraspinal, L2-4 RLSR, L5 RR ?Pelvis: Left anterior  innominate ? ?Electronically signed by:  ?Morgan Barker D.Morgan Barker ?[facility] ?8:11 AM 08/06/21 ?

## 2021-08-06 ENCOUNTER — Ambulatory Visit: Payer: No Typology Code available for payment source | Admitting: Sports Medicine

## 2021-08-06 VITALS — BP 124/80 | HR 79 | Ht 60.0 in | Wt 162.0 lb

## 2021-08-06 DIAGNOSIS — M9903 Segmental and somatic dysfunction of lumbar region: Secondary | ICD-10-CM | POA: Diagnosis not present

## 2021-08-06 DIAGNOSIS — M9905 Segmental and somatic dysfunction of pelvic region: Secondary | ICD-10-CM

## 2021-08-06 DIAGNOSIS — M546 Pain in thoracic spine: Secondary | ICD-10-CM

## 2021-08-06 DIAGNOSIS — M5442 Lumbago with sciatica, left side: Secondary | ICD-10-CM

## 2021-08-06 DIAGNOSIS — M9904 Segmental and somatic dysfunction of sacral region: Secondary | ICD-10-CM

## 2021-08-06 DIAGNOSIS — G8929 Other chronic pain: Secondary | ICD-10-CM

## 2021-08-06 MED ORDER — MELOXICAM 15 MG PO TABS: 15.0000 mg | ORAL_TABLET | Freq: Every day | ORAL | 0 refills | Status: DC

## 2021-08-06 NOTE — Patient Instructions (Addendum)
Good to see you ?- Start meloxicam 15 mg daily x2 weeks.  If still having pain after 2 weeks, complete 3rd-week of meloxicam. May use remaining meloxicam as needed once daily for pain control.  Do not to use additional NSAIDs while taking meloxicam.  May use Tylenol [phone] mg 2 to 3 times a day for breakthrough pain. ?Sciatic, piriformis HEP ?3 week follow up   ? ?

## 2021-08-25 NOTE — Progress Notes (Signed)
? [name] ?[facility] ?[place] ?Phone: [phone] ?  ?Assessment and Plan:   ?  ?1. Sciatica of left side ?2. Somatic dysfunction of lumbar region ?3. Somatic dysfunction of pelvic region ?4. Somatic dysfunction of sacral region ?-Chronic with exacerbation, subsequent visit ?- Significant improvement after HEP with patient no longer experiencing radicular symptoms ?- Patient did not start using meloxicam as HEP seem to be sufficient for alleviating her symptoms ?- May use meloxicam as needed ?- Continue HEP ?- Patient has received significant relief with OMT in the past.  Elects for repeat OMT today.  Tolerated well per note below. ?- Decision today to treat with OMT was based on Physical Exam ?  ?After verbal consent patient was treated with HVLA (high velocity low amplitude), ME (muscle energy), FPR (flex positional release), ST (soft tissue), PC/PD (Pelvic Compression/ Pelvic Decompression) techniques in sacrum, thoracic, lumbar, and pelvic areas. Patient tolerated the procedure well with improvement in symptoms.  Patient educated on potential side effects of soreness and recommended to rest, hydrate, and use Tylenol as needed for pain control. ?  ?Pertinent previous records reviewed include none ?  ?Follow Up: As needed for repeat OMT ?  ?Subjective:   ?I, Vilma Meckel, am serving as a Education administrator for Dr. Glennon Mac. ? ? ?Chief Complaint: OMT follow up  ?  ?HPI:  ?05/21/2021  ?Patient is a 56 year old female complaining of back pain . Patient states she has pain in the middle of her back that moves from side to side with low back pain when she sits for awhile she has pain under her hip and thigh been going on for about a year or so sometimes it does radiate, has not been taking ib or tylenol no heat or ice , no MOI  ?  ?06/11/2021 ?Patient states that she's doing pretty good  ?  ?07/09/2021 ?Patient states that her low back and left side thoracic back   has been hurting this morning  ?  ?08/06/2021 ?Patient states that her back has been bothering her since the last visit mid , low back and sciatic pain that goes down to the foot , left side sciatic goes down to her foot , right side goes just to her thigh, and low back pain all across the back  ? ?08/27/2021 ?Patient states doing a lot better. Exercises have been helping. Hasn't had to take meloxicam. ?  ?Relevant Historical Information: History cholecystectomy, hysterectomy.  Hypertension ? ?Additional pertinent review of systems negative. ? ?Current Outpatient Medications  ?Medication Sig Dispense Refill  ? cholecalciferol (VITAMIN D3) 25 MCG (1000 UNIT) tablet Take 1,000 Units by mouth daily.    ? losartan (COZAAR) 100 MG tablet TAKE 1 TABLET BY MOUTH EVERY DAY 90 tablet 3  ? meloxicam (MOBIC) 15 MG tablet Take 1 tablet (15 mg total) by mouth daily. 30 tablet 0  ? MINIVELLE 0.05 MG/24HR patch Place 1 patch onto the skin 2 (two) times a week.    ? Multiple Vitamin (MULTIVITAMIN WITH MINERALS) TABS tablet Take 1 tablet by mouth daily.    ? ?No current facility-administered medications for this visit.  ?  ?  ?Objective:   ?  ?Vitals:  ? 08/27/21 0757  ?BP: 124/78  ?Pulse: 85  ?SpO2: 98%  ?Weight: 153 lb (69.4 kg)  ?Height: 5' (1.524 m)  ?  ?  ?Body mass index is 29.88 kg/m?.  ?  ?Physical Exam:   ?  ?  General: Well-appearing, cooperative, sitting comfortably in no acute distress.  ? ?OMT Physical Exam: ? ?ASIS Compression Test: Positive Right ?Sacrum: TTP right sacral base, positive sphinx ?Lumbar: TTP paraspinal, L2 RRS R ?Pelvis: Right anterior innominate ? ?Electronically signed by:  ?[name] ?[facility] ?8:11 AM 08/27/21 ?

## 2021-08-27 ENCOUNTER — Ambulatory Visit: Payer: No Typology Code available for payment source | Admitting: Sports Medicine

## 2021-08-27 VITALS — BP 124/78 | HR 85 | Ht 60.0 in | Wt 153.0 lb

## 2021-08-27 DIAGNOSIS — M9904 Segmental and somatic dysfunction of sacral region: Secondary | ICD-10-CM | POA: Diagnosis not present

## 2021-08-27 DIAGNOSIS — M5432 Sciatica, left side: Secondary | ICD-10-CM | POA: Diagnosis not present

## 2021-08-27 DIAGNOSIS — M9903 Segmental and somatic dysfunction of lumbar region: Secondary | ICD-10-CM

## 2021-08-27 DIAGNOSIS — M9905 Segmental and somatic dysfunction of pelvic region: Secondary | ICD-10-CM | POA: Diagnosis not present

## 2021-08-27 NOTE — Patient Instructions (Signed)
Meloxicam as needed ?As need follow up for OMT ?

## 2021-10-31 ENCOUNTER — Other Ambulatory Visit: Payer: Self-pay | Admitting: Family Medicine

## 2022-01-18 ENCOUNTER — Encounter: Payer: Self-pay | Admitting: *Deleted

## 2022-03-25 LAB — HM PAP SMEAR: HPV, high-risk: NOT DETECTED

## 2022-03-25 LAB — HM MAMMOGRAPHY

## 2022-05-17 ENCOUNTER — Other Ambulatory Visit (INDEPENDENT_AMBULATORY_CARE_PROVIDER_SITE_OTHER): Payer: No Typology Code available for payment source

## 2022-05-17 ENCOUNTER — Other Ambulatory Visit: Payer: Self-pay

## 2022-05-17 DIAGNOSIS — I10 Essential (primary) hypertension: Secondary | ICD-10-CM

## 2022-05-17 DIAGNOSIS — Z0000 Encounter for general adult medical examination without abnormal findings: Secondary | ICD-10-CM

## 2022-05-17 DIAGNOSIS — E785 Hyperlipidemia, unspecified: Secondary | ICD-10-CM | POA: Diagnosis not present

## 2022-05-17 LAB — COMPREHENSIVE METABOLIC PANEL
ALT: 35 U/L (ref 0–35)
AST: 24 U/L (ref 0–37)
Albumin: 4.4 g/dL (ref 3.5–5.2)
Alkaline Phosphatase: 52 U/L (ref 39–117)
BUN: 8 mg/dL (ref 6–23)
CO2: 25 mEq/L (ref 19–32)
Calcium: 9.8 mg/dL (ref 8.4–10.5)
Chloride: 105 mEq/L (ref 96–112)
Creatinine, Ser: 0.62 mg/dL (ref 0.40–1.20)
GFR: 100.61 mL/min (ref >60.00)
Glucose, Bld: 96 mg/dL (ref 70–99)
Potassium: 3.8 mEq/L (ref 3.5–5.1)
Sodium: 138 mEq/L (ref 135–145)
Total Bilirubin: 0.8 mg/dL (ref 0.2–1.2)
Total Protein: 7.7 g/dL (ref 6.0–8.3)

## 2022-05-17 LAB — CBC WITH DIFFERENTIAL/PLATELET
Basophils Absolute: 0.1 10*3/uL (ref 0.0–0.1)
Basophils Relative: 1.5 % (ref 0.0–3.0)
Eosinophils Absolute: 0 10*3/uL (ref 0.0–0.7)
Eosinophils Relative: 0.7 % (ref 0.0–5.0)
HCT: 40.2 % (ref 36.0–46.0)
Hemoglobin: 13.1 g/dL (ref 12.0–15.0)
Lymphocytes Relative: 38.6 % (ref 12.0–46.0)
Lymphs Abs: 2.6 10*3/uL (ref 0.7–4.0)
MCHC: 32.7 g/dL (ref 30.0–36.0)
MCV: 84.6 fl (ref 78.0–100.0)
Monocytes Absolute: 0.4 10*3/uL (ref 0.1–1.0)
Monocytes Relative: 5.3 % (ref 3.0–12.0)
Neutro Abs: 3.6 10*3/uL (ref 1.4–7.7)
Neutrophils Relative %: 53.9 % (ref 43.0–77.0)
Platelets: 281 10*3/uL (ref 150.0–400.0)
RBC: 4.76 Mil/uL (ref 3.87–5.11)
RDW: 14.2 % (ref 11.5–15.5)
WBC: 6.7 10*3/uL (ref 4.0–10.5)

## 2022-05-17 LAB — POCT URINALYSIS DIPSTICK
Bilirubin, UA: NEGATIVE
Blood, UA: NEGATIVE
Glucose, UA: NEGATIVE
Ketones, UA: NEGATIVE
Leukocytes, UA: NEGATIVE
Nitrite, UA: NEGATIVE
Protein, UA: NEGATIVE
Spec Grav, UA: 1.01 (ref 1.010–1.025)
Urobilinogen, UA: 0.2 E.U./dL
pH, UA: 6 (ref 5.0–8.0)

## 2022-05-17 LAB — LIPID PANEL
Cholesterol: 233 mg/dL — ABNORMAL HIGH (ref 0–200)
HDL: 44.5 mg/dL (ref >39.00)
LDL Cholesterol: 165 mg/dL — ABNORMAL HIGH (ref 0–99)
NonHDL: 188.62
Total CHOL/HDL Ratio: 5
Triglycerides: 116 mg/dL (ref 0.0–149.0)
VLDL: 23.2 mg/dL (ref 0.0–40.0)

## 2022-05-17 LAB — TSH: TSH: 4.03 u[IU]/mL (ref 0.35–5.50)

## 2022-05-24 ENCOUNTER — Encounter: Payer: No Typology Code available for payment source | Admitting: Family Medicine

## 2022-05-24 ENCOUNTER — Telehealth: Payer: Self-pay | Admitting: Family Medicine

## 2022-05-24 NOTE — Telephone Encounter (Signed)
Called pt to reschedule due to provider being out sick. Pt already had labs done on 05/17/22. Pt is asking if she will need to repeat these for her rescheduled physical in June. Please advise

## 2022-05-24 NOTE — Telephone Encounter (Signed)
Returned pt call and made aware that in this case she will not need to have labs redrawn since provider was out sick and needed to r/s her CPE.

## 2022-10-06 ENCOUNTER — Encounter: Payer: Self-pay | Admitting: Family Medicine

## 2022-10-06 ENCOUNTER — Ambulatory Visit (INDEPENDENT_AMBULATORY_CARE_PROVIDER_SITE_OTHER): Payer: No Typology Code available for payment source | Admitting: Family Medicine

## 2022-10-06 VITALS — BP 130/80 | HR 97 | Temp 98.1°F | Ht 60.0 in | Wt 170.4 lb

## 2022-10-06 DIAGNOSIS — R1010 Upper abdominal pain, unspecified: Secondary | ICD-10-CM

## 2022-10-06 DIAGNOSIS — Z0000 Encounter for general adult medical examination without abnormal findings: Secondary | ICD-10-CM

## 2022-10-06 DIAGNOSIS — Z1211 Encounter for screening for malignant neoplasm of colon: Secondary | ICD-10-CM

## 2022-10-06 DIAGNOSIS — E785 Hyperlipidemia, unspecified: Secondary | ICD-10-CM | POA: Diagnosis not present

## 2022-10-06 DIAGNOSIS — R1013 Epigastric pain: Secondary | ICD-10-CM

## 2022-10-06 DIAGNOSIS — I10 Essential (primary) hypertension: Secondary | ICD-10-CM

## 2022-10-06 DIAGNOSIS — K625 Hemorrhage of anus and rectum: Secondary | ICD-10-CM

## 2022-10-06 MED ORDER — OMEPRAZOLE 40 MG PO CPDR: 40.0000 mg | DELAYED_RELEASE_CAPSULE | Freq: Every day | ORAL | 3 refills | Status: DC

## 2022-10-06 NOTE — Patient Instructions (Addendum)
Call [facility] Dr. Bosie Clos and get your colonoscopy schedule- I also placed a referral   Let us know if you get any COVID vaccines this fall.  We will call you within two weeks about your referral to CT abdomen/pelvis  through [facility].  Their phone number is [phone].  Please call them if you have not heard in a week.  Stay on omeprazole as long as no significant side effects until gastroenterology visit  Recommended follow up: Return in about 1 year (around 10/06/2023) for physical or sooner if needed.Schedule b4 you leave.  -if we don't have answers on the abdominal pain lets see each other sooner -new or worsening symptoms please let us know particularly fever, vomiting, unintentional weight loss

## 2022-10-06 NOTE — Progress Notes (Signed)
Phone [phone]   Subjective:  Patient presents today for their annual physical. Chief complaint-noted.   See problem oriented charting- ROS- full  review of systems was completed and negative except for: upper abdominal pain/reflux- occasional radiates to the back- also some diarrhea and nausea, allergies- congestion, sneezing, eye itching  The following were reviewed and entered/updated in epic: Past Medical History:  Diagnosis Date   Cervical stenosis (uterine cervix)    Endometrial polyp    w/ hx polyps (complex hyperplasia w/ focal atyical)   History of colon polyps    hyperplastic 2014   Hyperlipidemia    Hypertension    RBBB (right bundle branch block)    Wears contact lenses    Patient Active Problem List   Diagnosis Date Noted   Leukocytosis 02/16/2016    Priority: High   Hyperlipidemia 10/15/2014    Priority: Medium    HTN (hypertension) 07/02/2011    Priority: Medium    S/P total hysterectomy 11/09/2016    Priority: Low   History of colonic polyps 06/20/2014    Priority: Low   History of abnormal cervical Pap smear 06/20/2014    Priority: Low   Obesity (BMI 30-39.9) 07/06/2013    Priority: Low   NECK AND BACK PAIN 12/11/2007    Priority: Low   Alopecia 03/17/2007    Priority: Low   Past Surgical History:  Procedure Laterality Date   CERVICAL BIOPSY  W/ LOOP ELECTRODE EXCISION     COLONOSCOPY  last one 11-01-2012   DILATATION & CURETTAGE/HYSTEROSCOPY WITH MYOSURE N/A 01/08/2016   Procedure: DILATATION & CURETTAGE/HYSTEROSCOPY WITH MYOSURE;  Surgeon: Richardean Chimera, MD;  Location: [facility] [place];  Service: Gynecology;  Laterality: N/A;  MYOSURE FOR POLYPS   DILATATION & CURETTAGE/HYSTEROSCOPY WITH MYOSURE N/A 09/16/2016   Procedure: DILATATION & CURETTAGE/HYSTEROSCOPY WITH MYOSURE;  Surgeon: Richardean Chimera, MD;  Location: [facility];  Service: Gynecology;  Laterality: N/A;   LAPAROSCOPIC CHOLECYSTECTOMY  10/08/2005   LAPAROSCOPIC  VAGINAL HYSTERECTOMY WITH SALPINGECTOMY Bilateral 11/09/2016   Procedure: LAPAROSCOPIC ASSISTED VAGINAL HYSTERECTOMY WITH SALPINGECTOMY,  oophorectomy, cystoscopy;  Surgeon: Richardean Chimera, MD;  Location: [facility];  Service: Gynecology;  Laterality: Bilateral;    Family History  Problem Relation Age of Onset   Hypertension Mother    Colon cancer Father        diagnosed 50    Medications- reviewed and updated Current Outpatient Medications  Medication Sig Dispense Refill   cholecalciferol (VITAMIN D3) 25 MCG (1000 UNIT) tablet Take 1,000 Units by mouth daily.     losartan (COZAAR) 100 MG tablet TAKE 1 TABLET BY MOUTH EVERY DAY 90 tablet 3   MINIVELLE 0.05 MG/24HR patch Place 1 patch onto the skin 2 (two) times a week.     Multiple Vitamin (MULTIVITAMIN WITH MINERALS) TABS tablet Take 1 tablet by mouth daily.     omeprazole (PRILOSEC) 40 MG capsule Take 1 capsule (40 mg total) by mouth daily. 30 capsule 3   No current facility-administered medications for this visit.    Allergies-reviewed and updated No Known Allergies  Social History   Social History Narrative   Single. No children. Mother lives with patient.       Accountant at [employer]      Hobbies: church on Sunday, occasional movie, eat out   Objective  Objective:  BP 130/80   Pulse 97   Temp 98.1 F (36.7 C)   Ht 5' (1.524 m)   Wt 170 lb 6.4  oz (77.3 kg)   LMP 12/11/2015 (Approximate)   SpO2 97%   BMI 33.28 kg/m  Gen: NAD, resting comfortably HEENT: Mucous membranes are moist. Oropharynx normal Neck: no thyromegaly CV: RRR no murmurs rubs or gallops Lungs: CTAB no crackles, wheeze, rhonchi Abdomen: soft/nontender/nondistended/normal bowel sounds. No rebound or guarding. Exam benign today on abdomen but not actively having one of pain episodes today Ext: no edema Skin: warm, dry Neuro: grossly normal, moves all extremities, PERRLA   Assessment and Plan   57 y.o. female  presenting for annual physical.  Health Maintenance counseling: 1. Anticipatory guidance: Patient counseled regarding regular dental exams -q6 months, eye exams -q3 months with glaucoma history- sees next week,  avoiding smoking and second hand smoke , limiting alcohol to 1 beverage per day-does not drink , no illicit drugs .   2. Risk factor reduction:  Advised patient of need for regular exercise and diet rich and fruits and vegetables to reduce risk of heart attack and stroke.  Exercise- work was preventing last year with 12-16 hour days - did try to get some walking In at 5 30 in the morning until November but then after moms hip replacement fell back off. Sometimes can get 15-30 minute walks in at lunch Diet/weight management-weight up 14 lbs from last year - wants to work on cutting back on breads- that seems to be a big driver for her.  Wt Readings from Last 3 Encounters:  10/06/22 170 lb 6.4 oz (77.3 kg)  08/27/21 153 lb (69.4 kg)  08/06/21 162 lb (73.5 kg)  3. Immunizations/screenings/ancillary studies- may get fall COVID shot but held off for 2023 update as tends to upset her stomach,  otherwise up to date  Immunization History  Administered Date(s) Administered   Influenza-Unspecified 01/24/2013, 01/24/2014, 02/18/2016, 02/02/2017, 01/26/2018, 01/28/2019, 01/28/2019, 02/03/2020   Moderna Covid-19 Vaccine Bivalent Booster 74yrs & up 02/15/2021   Moderna Sars-Covid-2 Vaccination 08/05/2019, 09/02/2019, 04/22/2020   Td 02/24/2006   Tdap 02/16/2016   Zoster Recombinat (Shingrix) 04/11/2018, 06/13/2018   4. Cervical cancer screening- Followed by GYN up to date on PAP.  Dr. Arelia Sneddon. Last pap smear actually mentioned no further paps- due to total hysterectomy- but gets internal exams 5. Breast cancer screening-  breast exam monthly at home and gets exam with Dr. Arelia Sneddon yearly and mammogram  - up to date but we need records 6. Colon cancer screening -colonoscopy 12/22/17 with 5 year repeat  planned - will be due in August - went ahead and referred back today with rectal bleeding and abdominal discomfort to consider going ahead with colonoscopy and possible EGD  7. Skin cancer screening-  lower risk due to melanin content. advised regular sunscreen use. Denies worrisome, changing, or new skin lesions.  8. Birth control/STD check- minivelle patch for hot flashes- still helpful. Not dating- declines std screening 9. Osteoporosis screening at 65-Has had one several years ago at GYN= reports normal -Never smoker   Status of chronic or acute concerns   #hypertension S: medication: losartan 100 mg  -no recent checks BP Readings from Last 3 Encounters:  10/06/22 130/80  08/27/21 124/78  08/06/21 124/80  A/P: stable- continue current medicines   #hyperlipidemia The 10-year ASCVD risk score (Arnett DK, et al., 2019) is: 7.3% S: Medication:none. No known CAD history in family  Lab Results  Component Value Date   CHOL 233 (H) 05/17/2022   HDL 44.50 05/17/2022   LDLCALC 165 (H) 05/17/2022   LDLDIRECT 136.0 04/11/2018   TRIG  116.0 05/17/2022   CHOLHDL 5 05/17/2022   A/P: cholesterol up but weight up- she wants to work on this and recalculate risk next year- if gets over 7.5% consider CT calcium scoring -feels could cut down on bread and that would help with weight  # Upper abdominal pain and into left lower ribs (occasional burning in this area) with radiation to back S:Medication: none - prior OTC (available over the counter without a prescription) proton pump inhibitor (PPI) stomach acid reducer short term -no recent NSAIDs- mobic last year . She started with this upper abdominal pain with radiation at times to her back at least 18 months ago- at visit 05/18/21 we referred to sports medicine as was no longer as much epigastric and seemed to be more in the back- she saw them and tried meloxiam without significant long term relief- better short term - we recommended omeprazole trial  before dinner- helped a few weeks but later came off and symptoms worsened. No shortness of breath or left arm or neck pain.  -does at times get diarrhea or nausea. Occasional rectal bleeding- thinks hemorrhoids as at end of the stooling but not sure. Was having all these issues at time of labs in January -mild intermittent issue could be one to two times a week- not increasing in frequency - no unintentional weight loss, fevers, night sweats. Not always associated with meals or food . No melena.  -gallbladder removed 2007 A/P: upper abdominal pain radiating to ribs with radiation to back as well intermittent one to two times a week for now over 2 years with only partial response to proton pump inhibitor (PPI) stomach acid reducer when used in past - with duration get CT abdomen/pelvis mainly to rule out malignancy- gallbladder surgically absent so skipping ultrasound step  - trial omeprazole 40 mg until gastroenterology visit -refer to gastroenterology and consider EGD in addition to updating colonoscopy with rectal bleeding noted recently.   Recommended follow up: Return in about 1 year (around 10/06/2023) for physical or sooner if needed.Schedule b4 you leave.  Lab/Order associations: NO labs today- already had in January and symptoms stable so we did not repeat   ICD-10-CM   1. Preventative health care  Z00.00     2. Primary hypertension  I10     3. Hyperlipidemia, unspecified hyperlipidemia type  E78.5     4. Upper abdominal pain  R10.10 Ambulatory referral to Gastroenterology    CT Abdomen Pelvis W Contrast    5. Screen for colon cancer  Z12.11 Ambulatory referral to Gastroenterology    6. Rectal bleeding  K62.5 Ambulatory referral to Gastroenterology    7. Epigastric abdominal pain  R10.13 CT Abdomen Pelvis W Contrast      Meds ordered this encounter  Medications   omeprazole (PRILOSEC) 40 MG capsule    Sig: Take 1 capsule (40 mg total) by mouth daily.    Dispense:  30  capsule    Refill:  3    Return precautions advised.  Tana Conch, MD

## 2022-10-07 ENCOUNTER — Encounter: Payer: Self-pay | Admitting: Obstetrics and Gynecology

## 2022-11-03 ENCOUNTER — Other Ambulatory Visit: Payer: Self-pay | Admitting: Family Medicine

## 2022-11-03 ENCOUNTER — Ambulatory Visit
Admission: RE | Admit: 2022-11-03 | Discharge: 2022-11-03 | Disposition: A | Discharge status: Home / Self Care | Payer: No Typology Code available for payment source | Source: Ambulatory Visit | Attending: Family Medicine | Admitting: Family Medicine

## 2022-11-03 DIAGNOSIS — R1010 Upper abdominal pain, unspecified: Secondary | ICD-10-CM

## 2022-11-03 DIAGNOSIS — R1013 Epigastric pain: Secondary | ICD-10-CM

## 2022-11-03 MED ORDER — IOPAMIDOL (ISOVUE-300) INJECTION 61%
100.0000 mL | Freq: Once | INTRAVENOUS | Status: AC | PRN
  Administered 2022-11-03: 100 mL via INTRAVENOUS

## 2022-12-20 LAB — HM COLONOSCOPY

## 2022-12-22 ENCOUNTER — Encounter: Payer: Self-pay | Admitting: Internal Medicine

## 2022-12-29 ENCOUNTER — Encounter: Payer: Self-pay | Admitting: Gastroenterology

## 2023-01-01 ENCOUNTER — Other Ambulatory Visit: Payer: Self-pay | Admitting: Family Medicine

## 2023-03-02 ENCOUNTER — Telehealth: Payer: Self-pay | Admitting: Family Medicine

## 2023-03-02 NOTE — Telephone Encounter (Signed)
Advised: To be seen within 24 Hours  Patient is scheduled with Dr. Ruthine Dose 03/03/23  Patient Name First: Morgan Flirt Last: Lorenda Barker Gender: Unknown DOB: [birth year] Age: 57 Y 8 M 15 D Return Phone Number: [phone] (Primary) [place] [address] Client [facility] - Administrator, [facility] at [facility] Provider Tana Conch- MD Contact Type Call Who Is Calling Patient / Member / Family / Caregiver Call Type Triage / Clinical Relationship To Patient Self Return Phone Number [phone] (Primary) Chief Complaint CHEST PAIN - pain, pressure, heaviness or tightness Reason for Call Symptomatic / Request for Health Information Initial Comment Caller states she is experiencing chest pain Translation No Nurse Assessment Nurse: Nunzio Cory, RN, Sherrie Date/Time (Eastern Time): 03/02/2023 2:30:31 PM Confirm and document reason for call. If symptomatic, describe symptoms. ---Caller states having chest pain occasionally. States had flu vaccine on 02/16/2023. States pain under rib cage on the left side of chest. States pain is dull and non radiating. Sometimes has while sitting down. No SOB. +fluids, +UOP in 8 hrs. No fever Does the patient have any new or worsening symptoms? ---Yes Will a triage be completed? ---Yes Related visit to physician within the last 2 weeks? ---No Does the PT have any chronic conditions? (i.e. diabetes, asthma, this includes High risk factors for pregnancy, etc.) ---Yes List chronic conditions. ---HTN Is this a behavioral health or substance abuse call? ---No Guidelines Guideline Title Affirmed Question Affirmed Notes Nurse Date/Time Lamount Cohen Time) Chest Pain [1] Chest pain lasts < 5 minutes AND [2] NO chest pain or cardiac symptoms (e.g., breathing difficulty, sweating) Nunzio Cory, RN, Sherrie 03/02/2023 2:34:57 PM Guidelines Guideline Title Affirmed Question Affirmed Notes  Nurse Date/Time (Eastern Time) now (Exception: Chest pains that last only a few seconds.) Disp. Time Lamount Cohen Time) Disposition Final User 03/02/2023 2:29:13 PM Send to Urgent Anna Genre 03/02/2023 2:39:14 PM See PCP within 24 Hours Yes Nunzio Cory, RN, Sherrie Final Disposition 03/02/2023 2:39:14 PM See PCP within 24 Hours Yes Nunzio Cory, RN, Sherrie Caller Disagree/Comply Comply Caller Understands Yes PreDisposition Go to Urgent Care/Walk-In Clinic Care Advice Given Per Guideline SEE PCP WITHIN 24 HOURS: * IF OFFICE WILL BE OPEN: You need to be examined within the next 24 hours. Call your doctor (or NP/PA) when the office opens and make an appointment. CALL BACK IF: * Chest pain increases in frequency, duration or severity * Chest pain lasts over 5 minutes * You become worse Comments User: Holland Commons, RN Date/Time (Eastern Time): 03/02/2023 2:35:55 PM States when chest pain comes it is a 3-4/10. Referrals REFERRED TO PCP OFFICE

## 2023-03-02 NOTE — Telephone Encounter (Signed)
FYI

## 2023-03-02 NOTE — Telephone Encounter (Signed)
FYI: This call has been transferred to triage nurse: the Triage Nurse. Once the result note has been entered staff can address the message at that time.  Patient called in with the following symptoms:  Red Word:occasional chest pain, light headed   Please advise at Mobile [phone] (mobile)  Message is routed to Provider Pool.

## 2023-03-03 ENCOUNTER — Ambulatory Visit: Payer: No Typology Code available for payment source | Admitting: Family Medicine

## 2023-03-03 ENCOUNTER — Encounter: Payer: Self-pay | Admitting: Family Medicine

## 2023-03-03 VITALS — BP 123/77 | HR 91 | Temp 97.7°F | Resp 18 | Ht 60.0 in | Wt 168.5 lb

## 2023-03-03 DIAGNOSIS — M549 Dorsalgia, unspecified: Secondary | ICD-10-CM

## 2023-03-03 DIAGNOSIS — R079 Chest pain, unspecified: Secondary | ICD-10-CM

## 2023-03-03 LAB — POC URINALSYSI DIPSTICK (AUTOMATED)
Bilirubin, UA: NEGATIVE
Blood, UA: NEGATIVE
Glucose, UA: NEGATIVE
Ketones, UA: NEGATIVE
Leukocytes, UA: NEGATIVE
Nitrite, UA: NEGATIVE
Protein, UA: NEGATIVE
Spec Grav, UA: <=1.005 — AB (ref 1.010–1.025)
Urobilinogen, UA: 0.2 U/dL
pH, UA: 6 (ref 5.0–8.0)

## 2023-03-03 LAB — TSH: TSH: 2.71 u[IU]/mL (ref 0.35–5.50)

## 2023-03-03 LAB — COMPREHENSIVE METABOLIC PANEL
ALT: 21 U/L (ref 0–35)
AST: 17 U/L (ref 0–37)
Albumin: 4.8 g/dL (ref 3.5–5.2)
Alkaline Phosphatase: 55 U/L (ref 39–117)
BUN: 11 mg/dL (ref 6–23)
CO2: 27 meq/L (ref 19–32)
Calcium: 10.6 mg/dL — ABNORMAL HIGH (ref 8.4–10.5)
Chloride: 103 meq/L (ref 96–112)
Creatinine, Ser: 0.76 mg/dL (ref 0.40–1.20)
GFR: 88.04 mL/min (ref >60.00)
Glucose, Bld: 100 mg/dL — ABNORMAL HIGH (ref 70–99)
Potassium: 4 meq/L (ref 3.5–5.1)
Sodium: 138 meq/L (ref 135–145)
Total Bilirubin: 1.4 mg/dL — ABNORMAL HIGH (ref 0.2–1.2)
Total Protein: 8.4 g/dL — ABNORMAL HIGH (ref 6.0–8.3)

## 2023-03-03 LAB — CBC WITH DIFFERENTIAL/PLATELET
Basophils Absolute: 0 10*3/uL (ref 0.0–0.1)
Basophils Relative: 0.3 % (ref 0.0–3.0)
Eosinophils Absolute: 0 10*3/uL (ref 0.0–0.7)
Eosinophils Relative: 0.2 % (ref 0.0–5.0)
HCT: 42.8 % (ref 36.0–46.0)
Hemoglobin: 13.8 g/dL (ref 12.0–15.0)
Lymphocytes Relative: 23.8 % (ref 12.0–46.0)
Lymphs Abs: 1.8 10*3/uL (ref 0.7–4.0)
MCHC: 32.2 g/dL (ref 30.0–36.0)
MCV: 85.7 fL (ref 78.0–100.0)
Monocytes Absolute: 0.4 10*3/uL (ref 0.1–1.0)
Monocytes Relative: 5.8 % (ref 3.0–12.0)
Neutro Abs: 5.2 10*3/uL (ref 1.4–7.7)
Neutrophils Relative %: 69.9 % (ref 43.0–77.0)
Platelets: 294 10*3/uL (ref 150.0–400.0)
RBC: 4.99 Mil/uL (ref 3.87–5.11)
RDW: 14.3 % (ref 11.5–15.5)
WBC: 7.5 10*3/uL (ref 4.0–10.5)

## 2023-03-03 LAB — AMYLASE: Amylase: 45 U/L (ref 27–131)

## 2023-03-03 LAB — LIPASE: Lipase: 36 U/L (ref 11.0–59.0)

## 2023-03-03 LAB — POC COVID19 BINAXNOW: SARS Coronavirus 2 Ag: NEGATIVE

## 2023-03-03 MED ORDER — NITROGLYCERIN 0.4 MG SL SUBL: 0.4000 mg | SUBLINGUAL_TABLET | SUBLINGUAL | 0 refills | Status: AC | PRN

## 2023-03-03 NOTE — Progress Notes (Signed)
Subjective:     Patient ID: Morgan Barker, female    DOB: 16-May-1965, 57 y.o.   MRN: [id number]  Chief Complaint  Patient presents with   Chest Pain    Occasional chest pain that started a couple of weeks ago, had a flu shot on 02/16/23, started feeling bad after that with burning in the bottom of her feet, especially the left foot    HPI  Chest pain - Pt complains of occasional central chest pain starting a couple weeks ago. Describes her chest pain as a "minor discomfort". Episodes sporadically occur several times a day and tend to last a couple of minutes before sx fully resolve. Chest pain is not triggered with positional changes. She notes sx started after receiving her flu vaccine on 02/16/23, though she she is unsure it is related given she has not previous had intolerances to flu vaccines. Pt compliant with losartan 100 mg once daily and omeprazole 40 mg once daily. No heartburn.  Denies any burning sensation in chest or sharp pain. No headaches, dizziness, coughing, diaphoresis, sob, nausea, vomiting, diarrhea, constipation. Not currently established with cardiology.   Back pain - She reports intermittent mid-back pain. States back pain sometimes occurs simultaneously with chest pain. Episodes tend to occur sporadically and last a couple of minutes before resolving. Notes her back pain precedes the onset of chest pain and doesn't occur w/CP.   Reports some eye itchiness and nasal congestion after flu vaccine.    There are no preventive care reminders to display for this patient.   Past Medical History:  Diagnosis Date   Cervical stenosis (uterine cervix)    Endometrial polyp    w/ hx polyps (complex hyperplasia w/ focal atyical)   History of colon polyps    hyperplastic 2014   Hyperlipidemia    Hypertension    RBBB (right bundle branch block)    Wears contact lenses     Past Surgical History:  Procedure Laterality Date   CERVICAL BIOPSY  W/ LOOP ELECTRODE EXCISION      COLONOSCOPY  last one 11-01-2012   DILATATION & CURETTAGE/HYSTEROSCOPY WITH MYOSURE N/A 01/08/2016   Procedure: DILATATION & CURETTAGE/HYSTEROSCOPY WITH MYOSURE;  Surgeon: Richardean Chimera, MD;  Location: [facility];  Service: Gynecology;  Laterality: N/A;  MYOSURE FOR POLYPS   DILATATION & CURETTAGE/HYSTEROSCOPY WITH MYOSURE N/A 09/16/2016   Procedure: DILATATION & CURETTAGE/HYSTEROSCOPY WITH MYOSURE;  Surgeon: Richardean Chimera, MD;  Location: [facility] [place];  Service: Gynecology;  Laterality: N/A;   LAPAROSCOPIC CHOLECYSTECTOMY  10/08/2005   LAPAROSCOPIC VAGINAL HYSTERECTOMY WITH SALPINGECTOMY Bilateral 11/09/2016   Procedure: LAPAROSCOPIC ASSISTED VAGINAL HYSTERECTOMY WITH SALPINGECTOMY,  oophorectomy, cystoscopy;  Surgeon: Richardean Chimera, MD;  Location: [facility];  Service: Gynecology;  Laterality: Bilateral;     Current Outpatient Medications:    cholecalciferol (VITAMIN D3) 25 MCG (1000 UNIT) tablet, Take 1,000 Units by mouth daily., Disp: , Rfl:    losartan (COZAAR) 100 MG tablet, TAKE 1 TABLET BY MOUTH EVERY DAY, Disp: 90 tablet, Rfl: 3   MINIVELLE 0.05 MG/24HR patch, Place 1 patch onto the skin 2 (two) times a week., Disp: , Rfl:    Multiple Vitamin (MULTIVITAMIN WITH MINERALS) TABS tablet, Take 1 tablet by mouth daily., Disp: , Rfl:    nitroGLYCERIN (NITROSTAT) 0.4 MG SL tablet, Place 1 tablet (0.4 mg total) under the tongue every 5 (five) minutes as needed for chest pain., Disp: 50 tablet, Rfl: 0   omeprazole (PRILOSEC) 40 MG capsule, TAKE  1 CAPSULE (40 MG TOTAL) BY MOUTH DAILY., Disp: 90 capsule, Rfl: 1  No Known Allergies ROS neg/noncontributory except as noted HPI/below      Objective:     BP 123/77   Pulse 91   Temp 97.7 F (36.5 C) (Temporal)   Resp 18   Ht 5' (1.524 m)   Wt 168 lb 8 oz (76.4 kg)   LMP 12/11/2015 (Approximate)   SpO2 100%   BMI 32.91 kg/m  Wt Readings from Last 3 Encounters:  03/03/23 168 lb 8 oz (76.4 kg)   10/06/22 170 lb 6.4 oz (77.3 kg)  08/27/21 153 lb (69.4 kg)    Physical Exam   Gen: WDWN NAD HEENT: NCAT, conjunctiva not injected, sclera nonicteric. TM WNL B, OP moist, no exudates. NECK:  supple, no thyromegaly, no nodes, no carotid bruits CARDIAC: RRR, S1S2+, no murmur. DP 2+B.  LUNGS: CTAB. No wheezes ABDOMEN:  BS+, soft, NTND, No HSM, no masses EXT:  no edema MSK: no gross abnormalities.  NEURO: A&O x3.  CN II-XII intact.  PSYCH: normal mood. Good eye contact  Non TTP upper spine. No tenderness chest wall.   EKG: NSR, RBBB, LAE, No acute changes. Similar to 2018. EKG personally reviewed.  Results for orders placed or performed in visit on 03/03/23  POCT Urinalysis Dipstick (Automated)  Result Value Ref Range   Color, UA YELLOW    Clarity, UA CLEAR    Glucose, UA Negative Negative   Bilirubin, UA NEG    Ketones, UA NEG    Spec Grav, UA <=1.005 (A) 1.010 - 1.025   Blood, UA NEG    pH, UA 6.0 5.0 - 8.0   Protein, UA Negative Negative   Urobilinogen, UA 0.2 0.2 or 1.0 E.U./dL   Nitrite, UA NEG    Leukocytes, UA Negative Negative  POC COVID-19  Result Value Ref Range   SARS Coronavirus 2 Ag Negative Negative       Assessment & Plan:  Chest pain, unspecified type -     EKG 12-Lead -     CBC with Differential/Platelet -     Comprehensive metabolic panel -     TSH -     POCT Urinalysis Dipstick (Automated) -     Amylase -     Lipase -     Ambulatory referral to Cardiology -     POC COVID-19 BinaxNow  Upper back pain -     CBC with Differential/Platelet -     Comprehensive metabolic panel -     Amylase -     Lipase -     POC COVID-19 BinaxNow  Other orders -     Nitroglycerin; Place 1 tablet (0.4 mg total) under the tongue every 5 (five) minutes as needed for chest pain.  Dispense: 50 tablet; Refill: 0  1  atypical cp but pt w/HTN, HLD, mgma w/CHF in 60's.  ?post immunization, ?GERD, other   refer card-stable RBBB.  Increase PPI to bid.  Check labs 2   upper back pain-chronic.  Will do amylase.lipase to r/o pancreas  Return if symptoms worsen or fail to improve.    I, Isabelle Course, acting as a scribe for Angelena Sole, MD., have documented all relevant documentation on the behalf of Angelena Sole, MD, as directed by  Angelena Sole, MD while in the presence of Angelena Sole, MD.  I, Angelena Sole, MD, have reviewed all documentation for this visit. The documentation on 03/03/23 for  the exam, diagnosis, procedures, and orders are all accurate and complete.   Angelena Sole, MD

## 2023-03-03 NOTE — Patient Instructions (Signed)
Increase omeprazole to twice daily  take aspirin 81mg  daily.  Intractable pain, to ER  Refer Cardiology

## 2023-03-03 NOTE — Progress Notes (Signed)
Labs ok.  Calcium slightly elevated which is probably from the hydrochlorothiazide.  Nothing unusual to explain symptoms.  Should sch appt w/Dr. Durene Cal in 2-3 wks. Or sooner if not feeling better

## 2023-03-08 NOTE — Progress Notes (Unsigned)
Cardiology Office Note:   Date:  03/09/2023  ID:  Tracie Harrier, DOB 10-28-1965, MRN [id number] PCP:  Shelva Majestic, MD  [facility] Providers Cardiologist:  Alverda Skeans, MD Referring MD: Jeani Sow, MD  Chief Complaint/Reason for Referral:  Chest pain ASSESSMENT:    1. Precordial pain   2. Primary hypertension   3. CKD (chronic kidney disease) stage 2, GFR 60-89 ml/min   4. BMI 32.0-32.9,adult     PLAN:   In order of problems listed above: Chest pain:  We will obtain a coronary CTA and echocardiogram to evaluate further.  If the patient has mild obstructive coronary artery disease, they will require a statin (with goal LDL < 70) and aspirin, if they have high-grade disease we will need to consider optimal medical therapy and if symptoms are refractory to medical therapy, then a cardiac catheterization with possible PCI will be pursued to alleviate symptoms.  If they have high risk disease we will proceed directly to cardiac catheterization.   Hypertension: Blood pressure mildly elevated today.  Continue losartan for now. CKD stage II: Continue losartan. Elevated BMI: Diet and exercise modification.            Dispo:  Return if symptoms worsen or fail to improve.      Medication Adjustments/Labs and Tests Ordered: Current medicines are reviewed at length with the patient today.  Concerns regarding medicines are outlined above.  The following changes have been made:  no change   Labs/tests ordered: Orders Placed This Encounter  Procedures   CT CORONARY MORPH W/CTA COR W/SCORE W/CA W/CM &/OR WO/CM   ECHOCARDIOGRAM COMPLETE    Medication Changes: Meds ordered this encounter  Medications   metoprolol tartrate (LOPRESSOR) 100 MG tablet    Sig: Take 1 tablet (100 mg total) by mouth once for 1 dose. Take 90-120 minutes prior to scan.    Dispense:  1 tablet    Refill:  0    Current medicines are reviewed at length with the patient today.  The patient does  not have concerns regarding medicines.    History of Present Illness:      FOCUSED PROBLEM LIST:   Hypertension BMI 32 CKD stage II GERD Right bundle branch block  November 2024: The patient is a 57 year old female with the below was admitted with problems here for recommendations regarding chest pain.  She was seen by her primary care provider recently with complaints of occasional chest pain after her flu vaccination.  The patient tells me that she has developed occasional chest pain sometimes at rest and sometimes with exertion.  It feels like a burning pain at times.  Does not happen every time she exerts herself.  She denies any palpitations.  She has had no paroxysmal nocturnal dyspnea or orthopnea.  Her breathing has been stable.  She is under a lot of stress due to her mother's health and her job.  She is concerned that she may have obstructive coronary artery disease.  For this reason she was referred to cardiology for further recommendations.  She does not smoke.  She works as a IT trainer.          Current Medications: Current Meds  Medication Sig   cholecalciferol (VITAMIN D3) 25 MCG (1000 UNIT) tablet Take 1,000 Units by mouth daily.   losartan (COZAAR) 100 MG tablet TAKE 1 TABLET BY MOUTH EVERY DAY   metoprolol tartrate (LOPRESSOR) 100 MG tablet Take 1 tablet (100 mg total) by mouth once for  1 dose. Take 90-120 minutes prior to scan.   MINIVELLE 0.05 MG/24HR patch Place 1 patch onto the skin 2 (two) times a week.   Multiple Vitamin (MULTIVITAMIN WITH MINERALS) TABS tablet Take 1 tablet by mouth daily.   nitroGLYCERIN (NITROSTAT) 0.4 MG SL tablet Place 1 tablet (0.4 mg total) under the tongue every 5 (five) minutes as needed for chest pain.   omeprazole (PRILOSEC) 40 MG capsule TAKE 1 CAPSULE (40 MG TOTAL) BY MOUTH DAILY. (Patient taking differently: Take 40 mg by mouth in the morning and at bedtime.)     Review of Systems:   Please see the history of present illness.    All  other systems reviewed and are negative.     EKGs/Labs/Other Test Reviewed:   EKG: EKG performed November 2024 that I personally reviewed demonstrates sinus rhythm with right bundle branch  EKG Interpretation Date/Time:    Ventricular Rate:    PR Interval:    QRS Duration:    QT Interval:    QTC Calculation:   R Axis:      Text Interpretation:           Risk Assessment/Calculations:          Physical Exam:   VS:  BP 132/84   Pulse 79   Ht 5' (1.524 m)   Wt 161 lb (73 kg)   LMP 12/11/2015 (Approximate)   SpO2 99%   BMI 31.44 kg/m        Wt Readings from Last 3 Encounters:  03/09/23 161 lb (73 kg)  03/03/23 168 lb 8 oz (76.4 kg)  10/06/22 170 lb 6.4 oz (77.3 kg)      GENERAL:  No apparent distress, AOx3 HEENT:  No carotid bruits, +2 carotid impulses, no scleral icterus CAR: RRR no murmurs, gallops, rubs, or thrills RES:  Clear to auscultation bilaterally ABD:  Soft, nontender, nondistended, positive bowel sounds x 4 VASC:  +2 radial pulses, +2 carotid pulses NEURO:  CN 2-12 grossly intact; motor and sensory grossly intact PSYCH:  No active depression or anxiety EXT:  No edema, ecchymosis, or cyanosis  Signed, Orbie Pyo, MD  03/09/2023 9:40 AM    [facility] [address] [place], [place], [place]  [address] Phone: [phone]; Fax: [phone]   Note:  This document was prepared using Dragon voice recognition software and may include unintentional dictation errors.

## 2023-03-09 ENCOUNTER — Ambulatory Visit: Payer: No Typology Code available for payment source | Attending: Internal Medicine | Admitting: Internal Medicine

## 2023-03-09 ENCOUNTER — Encounter: Payer: Self-pay | Admitting: Internal Medicine

## 2023-03-09 VITALS — BP 132/84 | HR 79 | Ht 60.0 in | Wt 161.0 lb

## 2023-03-09 DIAGNOSIS — Z6832 Body mass index (BMI) 32.0-32.9, adult: Secondary | ICD-10-CM

## 2023-03-09 DIAGNOSIS — N182 Chronic kidney disease, stage 2 (mild): Secondary | ICD-10-CM

## 2023-03-09 DIAGNOSIS — I10 Essential (primary) hypertension: Secondary | ICD-10-CM

## 2023-03-09 DIAGNOSIS — R072 Precordial pain: Secondary | ICD-10-CM

## 2023-03-09 MED ORDER — METOPROLOL TARTRATE 100 MG PO TABS: 100.0000 mg | ORAL_TABLET | Freq: Once | ORAL | 0 refills | Status: DC

## 2023-03-09 NOTE — Patient Instructions (Addendum)
Medication Instructions:  No changes *If you need a refill on your cardiac medications before your next appointment, please call your pharmacy*   Lab Work: none   Testing/Procedures: Your physician has requested that you have an echocardiogram. Echocardiography is a painless test that uses sound waves to create images of your heart. It provides your doctor with information about the size and shape of your heart and how well your heart's chambers and valves are working. This procedure takes approximately one hour. There are no restrictions for this procedure. Please do NOT wear cologne, perfume, aftershave, or lotions (deodorant is allowed). Please arrive 15 minutes prior to your appointment time.  Please note: We ask at that you not bring children with you during ultrasound (echo/ vascular) testing. Due to room size and safety concerns, children are not allowed in the ultrasound rooms during exams. Our front office staff cannot provide observation of children in our lobby area while testing is being conducted. An adult accompanying a patient to their appointment will only be allowed in the ultrasound room at the discretion of the ultrasound technician under special circumstances. We apologize for any inconvenience.  Coronary CT Angiogram - see instructions below   Follow-Up: As needed    Your cardiac CT will be scheduled at  [facility] [address] [place], [place] [address] [phone]  Please arrive at the [facility] En[facility] (Entrance C2) of [facility] 30 minutes prior to test start time. You can use the FREE valet parking offered at entrance C (encouraged to control the heart rate for the test)  Proceed to the [facility] Radiology Department (first floor) to check-in and test prep.  All radiology patients and guests should use entrance C2 at [facility], accessed from [facility], even though the hospital's physical  address listed is [address]      Please follow these instructions carefully (unless otherwise directed):  An IV will be required for this test and Nitroglycerin will be given.    On the Night Before the Test: Be sure to Drink plenty of water. Do not consume any caffeinated/decaffeinated beverages or chocolate 12 hours prior to your test. Do not take any antihistamines 12 hours prior to your test.  On the Day of the Test: Drink plenty of water until 1 hour prior to the test. Do not eat any food 1 hour prior to test. You may take your regular medications prior to the test.  Take metoprolol (Lopressor) two hours prior to test. FEMALES- please wear underwire-free bra if available, avoid dresses & tight clothing    After the Test: Drink plenty of water. After receiving IV contrast, you may experience a mild flushed feeling. This is normal. On occasion, you may experience a mild rash up to 24 hours after the test. This is not dangerous. If this occurs, you can take Benadryl 25 mg and increase your fluid intake. If you experience trouble breathing, this can be serious. If it is severe call 911 IMMEDIATELY. If it is mild, please call our office. If you take any of these medications: Glipizide/Metformin, Avandament, Glucavance, please do not take 48 hours after completing test unless otherwise instructed.  We will call to schedule your test 2-4 weeks out understanding that some insurance companies will need an authorization prior to the service being performed.   For more information and frequently asked questions, please visit our website : [email or web address]  For non-scheduling related questions, please contact the  cardiac imaging nurse navigator should you have any questions/concerns: Cardiac Imaging Nurse Navigators Direct Office Dial: [phone]   For scheduling needs, including cancellations and rescheduling, please call [place], [phone].

## 2023-03-21 ENCOUNTER — Other Ambulatory Visit: Payer: Self-pay

## 2023-03-21 ENCOUNTER — Encounter (HOSPITAL_COMMUNITY): Payer: Self-pay

## 2023-03-21 ENCOUNTER — Encounter: Payer: Self-pay | Admitting: Family Medicine

## 2023-03-21 MED ORDER — OMEPRAZOLE 40 MG PO CPDR: 40.0000 mg | DELAYED_RELEASE_CAPSULE | Freq: Two times a day (BID) | ORAL | 1 refills | Status: DC

## 2023-03-23 ENCOUNTER — Ambulatory Visit (HOSPITAL_COMMUNITY)
Admission: RE | Admit: 2023-03-23 | Discharge: 2023-03-23 | Disposition: A | Discharge status: Home / Self Care | Payer: No Typology Code available for payment source | Source: Ambulatory Visit | Attending: Internal Medicine | Admitting: Internal Medicine

## 2023-03-23 DIAGNOSIS — R072 Precordial pain: Secondary | ICD-10-CM | POA: Insufficient documentation

## 2023-03-23 MED ORDER — METOPROLOL TARTRATE 5 MG/5ML IV SOLN
10.0000 mg | Freq: Once | INTRAVENOUS | Status: AC
  Administered 2023-03-23: 5 mg via INTRAVENOUS

## 2023-03-23 MED ORDER — NITROGLYCERIN 0.4 MG SL SUBL
0.8000 mg | SUBLINGUAL_TABLET | Freq: Once | SUBLINGUAL | Status: AC
  Administered 2023-03-23: 0.8 mg via SUBLINGUAL

## 2023-03-23 MED ORDER — NITROGLYCERIN 0.4 MG SL SUBL
SUBLINGUAL_TABLET | SUBLINGUAL | Status: AC
  Filled 2023-03-23: qty 2

## 2023-03-23 MED ORDER — IOHEXOL 350 MG/ML SOLN
95.0000 mL | Freq: Once | INTRAVENOUS | Status: AC | PRN
  Administered 2023-03-23: 95 mL via INTRAVENOUS

## 2023-03-23 MED ORDER — METOPROLOL TARTRATE 5 MG/5ML IV SOLN
INTRAVENOUS | Status: AC
Start: 2023-03-23 — End: ?
  Filled 2023-03-23: qty 10

## 2023-04-04 ENCOUNTER — Other Ambulatory Visit: Payer: Self-pay | Admitting: Obstetrics and Gynecology

## 2023-04-04 DIAGNOSIS — R928 Other abnormal and inconclusive findings on diagnostic imaging of breast: Secondary | ICD-10-CM

## 2023-04-12 LAB — HM MAMMOGRAPHY

## 2023-04-13 ENCOUNTER — Ambulatory Visit (HOSPITAL_COMMUNITY): Payer: No Typology Code available for payment source | Attending: Internal Medicine

## 2023-04-13 DIAGNOSIS — R072 Precordial pain: Secondary | ICD-10-CM | POA: Insufficient documentation

## 2023-04-13 LAB — ECHOCARDIOGRAM COMPLETE
Area-P 1/2: 4.73 cm2
S' Lateral: 2.5 cm

## 2023-04-16 ENCOUNTER — Other Ambulatory Visit: Payer: Self-pay | Admitting: Family Medicine

## 2023-04-22 ENCOUNTER — Ambulatory Visit
Admission: RE | Admit: 2023-04-22 | Discharge: 2023-04-22 | Disposition: A | Discharge status: Home / Self Care | Payer: No Typology Code available for payment source | Source: Ambulatory Visit | Attending: Obstetrics and Gynecology | Admitting: Obstetrics and Gynecology

## 2023-04-22 DIAGNOSIS — R928 Other abnormal and inconclusive findings on diagnostic imaging of breast: Secondary | ICD-10-CM

## 2023-10-07 ENCOUNTER — Ambulatory Visit (INDEPENDENT_AMBULATORY_CARE_PROVIDER_SITE_OTHER): Payer: No Typology Code available for payment source | Admitting: Family Medicine

## 2023-10-07 ENCOUNTER — Encounter: Payer: Self-pay | Admitting: Family Medicine

## 2023-10-07 VITALS — BP 120/76 | HR 80 | Temp 97.6°F | Ht 61.0 in | Wt 171.8 lb

## 2023-10-07 DIAGNOSIS — I10 Essential (primary) hypertension: Secondary | ICD-10-CM | POA: Diagnosis not present

## 2023-10-07 DIAGNOSIS — Z131 Encounter for screening for diabetes mellitus: Secondary | ICD-10-CM | POA: Diagnosis not present

## 2023-10-07 DIAGNOSIS — E785 Hyperlipidemia, unspecified: Secondary | ICD-10-CM

## 2023-10-07 DIAGNOSIS — Z23 Encounter for immunization: Secondary | ICD-10-CM

## 2023-10-07 DIAGNOSIS — Z79899 Other long term (current) drug therapy: Secondary | ICD-10-CM

## 2023-10-07 DIAGNOSIS — E669 Obesity, unspecified: Secondary | ICD-10-CM

## 2023-10-07 DIAGNOSIS — Z0000 Encounter for general adult medical examination without abnormal findings: Secondary | ICD-10-CM

## 2023-10-07 NOTE — Progress Notes (Addendum)
 Phone [phone]   Subjective:  Patient presents today for their annual physical. Chief complaint-noted.   See problem oriented charting- ROS- full  review of systems was completed and negative Per full ROS sheet completed by patient except for topics noted under acute/chronic concerns  The following were reviewed and entered/updated in epic: Past Medical History:  Diagnosis Date   Cervical stenosis (uterine cervix)    Endometrial polyp    w/ hx polyps (complex hyperplasia w/ focal atyical)   History of colon polyps    hyperplastic 2014   Hyperlipidemia    Hypertension    RBBB (right bundle branch block)    Wears contact lenses    Patient Active Problem List   Diagnosis Date Noted   Leukocytosis 02/16/2016    Priority: High   Hyperlipidemia 10/15/2014    Priority: Medium    HTN (hypertension) 07/02/2011    Priority: Medium    S/P total hysterectomy 11/09/2016    Priority: Low   History of colonic polyps 06/20/2014    Priority: Low   History of abnormal cervical Pap smear 06/20/2014    Priority: Low   Obesity (BMI 30-39.9) 07/06/2013    Priority: Low   NECK AND BACK PAIN 12/11/2007    Priority: Low   Alopecia 03/17/2007    Priority: Low   Past Surgical History:  Procedure Laterality Date   ABDOMINAL HYSTERECTOMY  11/09/2016   Total Hysterctomy/Laparoscopic   CERVICAL BIOPSY  W/ LOOP ELECTRODE EXCISION     COLONOSCOPY  last one 11-01-2012   DILATATION & CURETTAGE/HYSTEROSCOPY WITH MYOSURE N/A 01/08/2016   Procedure: DILATATION & CURETTAGE/HYSTEROSCOPY WITH MYOSURE;  Surgeon: Merryl Abraham, MD;  Location: [facility];  Service: Gynecology;  Laterality: N/A;  MYOSURE FOR POLYPS   DILATATION & CURETTAGE/HYSTEROSCOPY WITH MYOSURE N/A 09/16/2016   Procedure: DILATATION & CURETTAGE/HYSTEROSCOPY WITH MYOSURE;  Surgeon: Merryl Abraham, MD;  Location: [facility] ;  Service: Gynecology;  Laterality: N/A;   LAPAROSCOPIC CHOLECYSTECTOMY  10/08/2005    LAPAROSCOPIC VAGINAL HYSTERECTOMY WITH SALPINGECTOMY Bilateral 11/09/2016   Procedure: LAPAROSCOPIC ASSISTED VAGINAL HYSTERECTOMY WITH SALPINGECTOMY,  oophorectomy, cystoscopy;  Surgeon: Merryl Abraham, MD;  Location: [facility];  Service: Gynecology;  Laterality: Bilateral;    Family History  Problem Relation Age of Onset   Hypertension Mother    Kidney Stones Mother    Arthritis Mother    Hyperlipidemia Mother    Colon cancer Father        diagnosed 48   Cancer Father    Diabetes Father    Arthritis Maternal Grandfather    Heart disease Maternal Grandmother    Diabetes Paternal Grandfather    Stroke Paternal Grandfather    Stroke Paternal Grandmother    Hyperlipidemia Brother    Hyperlipidemia Brother     Medications- reviewed and updated Current Outpatient Medications  Medication Sig Dispense Refill   cholecalciferol (VITAMIN D3) 25 MCG (1000 UNIT) tablet Take 1,000 Units by mouth daily.     losartan  (COZAAR ) 100 MG tablet TAKE 1 TABLET BY MOUTH EVERY DAY 90 tablet 3   MINIVELLE 0.05 MG/24HR patch Place 1 patch onto the skin 2 (two) times a week.     Multiple Vitamin (MULTIVITAMIN WITH MINERALS) TABS tablet Take 1 tablet by mouth daily.     nitroGLYCERIN  (NITROSTAT ) 0.4 MG SL tablet Place 1 tablet (0.4 mg total) under the tongue every 5 (five) minutes as needed for chest pain. 50 tablet 0   omeprazole  (PRILOSEC) 40 MG capsule TAKE 1  CAPSULE IN THE MORNING AND AT BEDTIME 180 capsule 1   No current facility-administered medications for this visit.    Allergies-reviewed and updated No Known Allergies  Social History   Social History Narrative   Single. No children. Mother lives with patient.       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 Oropharynx normal Neck: no thyromegaly CV: RRR no murmurs rubs or gallops Lungs: CTAB no crackles, wheeze, rhonchi Abdomen: soft/nontender/nondistended/normal bowel sounds. No rebound or guarding.  Ext: no edema Skin: warm, dry Neuro: grossly normal, moves all extremities, PERRLA   Assessment and Plan   58 y.o. female presenting for annual physical.  Health Maintenance counseling: 1. Anticipatory guidance: Patient counseled regarding regular dental exams -q6 months, eye exams-yearly with [facility]- monitoring eye pressure,  avoiding smoking and second hand smoke , limiting alcohol to 1 beverage per day- none , no illicit drugs .   2. Risk factor reduction:  Advised patient of need for regular exercise and diet rich and fruits and vegetables to reduce risk of heart attack and stroke.  Exercise- feels could improve on this area.  Diet/weight management-weight within 1 pound of last year-still feels bread is a big driver for her- has increased this lately- wants to back off- had lost weight into November before slipping back some when mom was ill.  Wt Readings from Last 3 Encounters:  10/07/23 171 lb 12.8 oz (77.9 kg)  03/09/23 161 lb (73 kg)  03/03/23 168 lb 8 oz (76.4 kg)  3. Immunizations/screenings/ancillary studies-up-to-date other than option of Prevnar 20- opts in  Immunization History  Administered Date(s) Administered   Influenza-Unspecified 01/24/2013, 01/24/2014, 02/18/2016, 02/02/2017, 01/26/2018, 01/28/2019, 01/28/2019, 02/03/2020, 02/07/2021, 02/13/2022, 02/16/2023   Moderna Covid-19 Vaccine Bivalent Booster 49yrs & up 02/15/2021   Moderna Sars-Covid-2 Vaccination 08/05/2019, 09/02/2019, 04/22/2020   Td 02/24/2006   Tdap 02/16/2016   Zoster Recombinant(Shingrix ) 04/11/2018, 06/13/2018  4. Cervical cancer screening- followed by GYN still.  Sees Dr. Mccomb.  History of  total hysterectomy for benign reasons-they do not do regular Paps but still do pelvic exam 5. Breast cancer screening-  breast exam with GYN and mammogram completed 04/22/2023-did require diagnostic and ultrasound but thankfully reassuring 6. Colon cancer screening - updated colonoscopy 12/20/2022 with 5-year repeat planned with [facility] 7. Skin cancer screening- lower risk due to melanin content. advised regular sunscreen use. Denies worrisome, changing, or new skin lesions- other than one spot on back [facility] Theron Flavin  8. Birth control/STD check- remains on Minivelle patch for hot flashes-still helpful.  Not dating and declines STD screening 9. Osteoporosis screening at 65-follows this with GYN-reports normal in the past 10. Smoking associated screening -never smoker  Status of chronic or acute concerns   #social update- mom had urosepsis last year but thankfully has turned the corner- doing much better  # Atypical chest pain-possibly related to GERD  S: Patient was seen in November by Dr. Kulik-started on proton pump inhibitor twice daily instead of once daily.  Did have mildly elevated calcium but  thought related to hydrochlorothiazide  potentially.  Amylase and lipase reassuring check to due to some back pain - Referred to cardiology Dr. Lorie Rook and they opted for coronary CTA (coronary artery calcium score of 0 and no significant coronary artery disease noted) and echocardiogram (normal other than EF 60 to 65%)  -She did do omeprazole  40 mg twice daily for several months and currently doing once a day and helpful still  A/P: pain could have been reflux related- doing well now on just once a day proton pump inhibitor (PPI) stomach acid reducer- continue current medications . Thankful for excellent cardiac workup and reassuring results  -bilirubin was slightly high   #hypertension S: medication: Losartan  100 mg -Prior hypercalcemia on  hydrochlorothiazide  25 mg A/P: blood pressure well controlled continue current medications    #hyperlipidemia with CT calcium scoring of 0 on 03/23/2023 with cardiology S: Medication:None Lab Results  Component Value Date   CHOL 233 (H) 05/17/2022   HDL 44.50 05/17/2022   LDLCALC 165 (H) 05/17/2022   LDLDIRECT 136.0 04/11/2018   TRIG 116.0 05/17/2022   CHOLHDL 5 05/17/2022  A/P: mild to moderate lipid elevations but with no calcium on recent testing- I would continue to work on lifestyle and hold off on meds  #MUSCULOSKELETAL- issues come and go and she tolerates essentially every few weeks --some low back pain that goes into hips -thoracic back pain -if more persistent we can refer to sports medicine or orthopedic   Recommended follow up: Return in about 1 year (around 10/06/2024) for physical or sooner if needed.Schedule b4 you leave.  Lab/Order associations: fasting   ICD-10-CM   1. Preventative health care  Z00.00     2. Primary hypertension  I10     3. Hyperlipidemia, unspecified hyperlipidemia type  E78.5 Comprehensive metabolic panel with GFR    CBC with Differential/Platelet    Lipid panel    4. Screening for diabetes mellitus  Z13.1 Hemoglobin A1c    5. Obesity (BMI 30-39.9)  E66.9 Hemoglobin A1c    6. High risk medication use  Z79.899 Vitamin B12      No orders of the defined types were placed in this encounter.   Return precautions advised.  Clarisa Crooked, MD

## 2023-10-07 NOTE — Addendum Note (Signed)
 Addended by: Arva Lathe on: 10/07/2023 09:48 AM   Modules accepted: Orders

## 2023-10-07 NOTE — Patient Instructions (Addendum)
 Prevnar 20 today  Please stop by lab before you go If you have mychart- we will send your results within 3 business days of us  receiving them.  If you do not have mychart- we will call you about results within 5 business days of us  receiving them.  *please also note that you will see labs on mychart as soon as they post. I will later go in and write notes on them- will say notes from Dr. Arlene Ben   Goal long term 150 minutes exercise per week. I like your plan to cut down on bread as well and try to get going again on weight loss  Recommended follow up: Return in about 1 year (around 10/06/2024) for physical or sooner if needed.Schedule b4 you leave.

## 2023-10-07 NOTE — Addendum Note (Signed)
 Addended by: Almira Jaeger on: 10/07/2023 09:35 AM   Modules accepted: Orders

## 2023-10-08 LAB — CBC WITH DIFFERENTIAL/PLATELET
Absolute Lymphocytes: 2244 {cells}/uL (ref 850–3900)
Absolute Monocytes: 376 {cells}/uL (ref 200–950)
Basophils Absolute: 53 {cells}/uL (ref 0–200)
Basophils Relative: 0.8 %
Eosinophils Absolute: 33 {cells}/uL (ref 15–500)
Eosinophils Relative: 0.5 %
HCT: 43.7 % (ref 35.0–45.0)
Hemoglobin: 13.6 g/dL (ref 11.7–15.5)
MCH: 27.3 pg (ref 27.0–33.0)
MCHC: 31.1 g/dL — ABNORMAL LOW (ref 32.0–36.0)
MCV: 87.6 fL (ref 80.0–100.0)
MPV: 10.6 fL (ref 7.5–12.5)
Monocytes Relative: 5.7 %
Neutro Abs: 3894 {cells}/uL (ref 1500–7800)
Neutrophils Relative %: 59 %
Platelets: 274 10*3/uL (ref 140–400)
RBC: 4.99 10*6/uL (ref 3.80–5.10)
RDW: 13.3 % (ref 11.0–15.0)
Total Lymphocyte: 34 %
WBC: 6.6 10*3/uL (ref 3.8–10.8)

## 2023-10-08 LAB — COMPREHENSIVE METABOLIC PANEL WITH GFR
AG Ratio: 1.6 (calc) (ref 1.0–2.5)
ALT: 23 U/L (ref 6–29)
AST: 19 U/L (ref 10–35)
Albumin: 4.7 g/dL (ref 3.6–5.1)
Alkaline phosphatase (APISO): 59 U/L (ref 37–153)
BUN: 10 mg/dL (ref 7–25)
CO2: 25 mmol/L (ref 20–32)
Calcium: 10 mg/dL (ref 8.6–10.4)
Chloride: 104 mmol/L (ref 98–110)
Creat: 0.7 mg/dL (ref 0.50–1.03)
Globulin: 2.9 g/dL (ref 1.9–3.7)
Glucose, Bld: 87 mg/dL (ref 65–99)
Potassium: 3.9 mmol/L (ref 3.5–5.3)
Sodium: 140 mmol/L (ref 135–146)
Total Bilirubin: 1.2 mg/dL (ref 0.2–1.2)
Total Protein: 7.6 g/dL (ref 6.1–8.1)
eGFR: 101 mL/min/{1.73_m2} (ref >=60)

## 2023-10-08 LAB — URINALYSIS, ROUTINE W REFLEX MICROSCOPIC
Bilirubin Urine: NEGATIVE
Glucose, UA: NEGATIVE
Hgb urine dipstick: NEGATIVE
Ketones, ur: NEGATIVE
Leukocytes,Ua: NEGATIVE
Nitrite: NEGATIVE
Protein, ur: NEGATIVE
Specific Gravity, Urine: 1.007 (ref 1.001–1.035)
pH: 6 (ref 5.0–8.0)

## 2023-10-08 LAB — HEMOGLOBIN A1C
Hgb A1c MFr Bld: 5.5 % (ref <5.7)
Mean Plasma Glucose: 111 mg/dL
eAG (mmol/L): 6.2 mmol/L

## 2023-10-08 LAB — LIPID PANEL
Cholesterol: 232 mg/dL — ABNORMAL HIGH (ref <200)
HDL: 46 mg/dL — ABNORMAL LOW (ref >=50)
LDL Cholesterol (Calc): 155 mg/dL — ABNORMAL HIGH
Non-HDL Cholesterol (Calc): 186 mg/dL — ABNORMAL HIGH (ref <130)
Total CHOL/HDL Ratio: 5 (calc) — ABNORMAL HIGH (ref <5.0)
Triglycerides: 175 mg/dL — ABNORMAL HIGH (ref <150)

## 2023-10-08 LAB — VITAMIN B12: Vitamin B-12: 907 pg/mL (ref 200–1100)

## 2023-10-10 ENCOUNTER — Ambulatory Visit: Payer: Self-pay | Admitting: Family Medicine

## 2023-10-27 ENCOUNTER — Other Ambulatory Visit: Payer: Self-pay | Admitting: Family Medicine

## 2024-03-24 ENCOUNTER — Other Ambulatory Visit: Payer: Self-pay | Admitting: Family Medicine

## 2024-04-12 ENCOUNTER — Other Ambulatory Visit: Payer: Self-pay | Admitting: Obstetrics and Gynecology

## 2024-04-12 DIAGNOSIS — R221 Localized swelling, mass and lump, neck: Secondary | ICD-10-CM

## 2024-04-25 ENCOUNTER — Ambulatory Visit: Payer: Self-pay

## 2024-04-25 ENCOUNTER — Ambulatory Visit
Admission: RE | Admit: 2024-04-25 | Discharge: 2024-04-25 | Disposition: A | Discharge status: Home / Self Care | Source: Ambulatory Visit | Attending: Obstetrics and Gynecology | Admitting: Obstetrics and Gynecology

## 2024-04-25 DIAGNOSIS — R221 Localized swelling, mass and lump, neck: Secondary | ICD-10-CM

## 2024-04-25 NOTE — Telephone Encounter (Signed)
 Pt refused earlier appt, prefers to be seen at PCP office. Scheduled. Advised UC if not improving or ED if symptoms become severe.  FYI Only or Action Required?: FYI only for provider: appointment scheduled on 06/03/23.  Patient was last seen in primary care on 10/07/2023 by Katrinka Garnette KIDD, MD.  Called Nurse Triage reporting Sore Throat.  Symptoms began several days ago.  Interventions attempted: OTC medications: tylenol .  Symptoms are: unchanged.  Triage Disposition: See Physician Within 24 Hours  Patient/caregiver understands and will follow disposition?: No, refuses disposition  Reason for Disposition  Earache  (Exceptions: Brief ear pain of lasting less than 60 minutes, or earache occurring during air travel.)  Answer Assessment - Initial Assessment Questions Pt states that on 04/23/24, she developed right ear pain and sore throat that is described as intermittent. When it occurs, pain is described as uncomfortable 4-5/10. Pt has been taking tylenol . Denies recent illness or loss of hearing.   1. LOCATION: Which ear is involved?     Right ear 2. ONSET: When did the ear pain start?      04/23/24 3. SEVERITY: How bad is the pain?  (Scale 1-10; mild, moderate or severe)     4-5/10 4. URI SYMPTOMS: Do you have a runny nose or cough?     Denies but has a sore throat 5. FEVER: Do you have a fever? If Yes, ask: What is your temperature, how was it measured, and when did it start?     Denies 6. CAUSE: Have you been swimming recently?, How often do you use Q-TIPS?, Have you had any recent air travel or scuba diving?     Unknown  Protocols used: Earache-A-AH  Copied from [id number]. Topic: Clinical - Red Word Triage >> Apr 25, 2024 11:08 AM Ber[place] MATSU wrote: Red Word that prompted transfer to Nurse Triage:  right side throat swelling pain; and ear ache and jaw pain for 3 days.

## 2024-04-29 ENCOUNTER — Ambulatory Visit

## 2024-05-02 ENCOUNTER — Ambulatory Visit: Admitting: Family

## 2024-10-09 ENCOUNTER — Encounter: Admitting: Family Medicine

## 2024-11-05 ENCOUNTER — Encounter: Admitting: Family Medicine

## 2024-11-14 ENCOUNTER — Encounter: Admitting: Family Medicine
